# Patient Record
Sex: Male | Born: 1980 | Race: White | Hispanic: No | Marital: Married | Smoking: Never smoker
Health system: Southern US, Community
[De-identification: ages and names within clinical notes are randomized; demographics above are authoritative.]

## PROBLEM LIST (undated history)

## (undated) DIAGNOSIS — Z8709 Personal history of other diseases of the respiratory system: Secondary | ICD-10-CM

## (undated) DIAGNOSIS — F419 Anxiety disorder, unspecified: Secondary | ICD-10-CM

## (undated) DIAGNOSIS — R6510 Systemic inflammatory response syndrome (SIRS) of non-infectious origin without acute organ dysfunction: Secondary | ICD-10-CM

## (undated) DIAGNOSIS — J189 Pneumonia, unspecified organism: Secondary | ICD-10-CM

## (undated) HISTORY — PX: OTHER SURGICAL HISTORY: SHX169

## (undated) HISTORY — DX: Anxiety disorder, unspecified: F41.9

## (undated) HISTORY — DX: Systemic inflammatory response syndrome (sirs) of non-infectious origin without acute organ dysfunction: R65.10

## (undated) HISTORY — DX: Pneumonia, unspecified organism: J18.9

## (undated) HISTORY — DX: Personal history of other diseases of the respiratory system: Z87.09

---

## 2014-06-24 ENCOUNTER — Emergency Department: Payer: Self-pay | Admitting: Internal Medicine

## 2014-06-24 LAB — RAPID HIV SCREEN (HIV 1/2 AB+AG)

## 2016-06-28 DIAGNOSIS — J189 Pneumonia, unspecified organism: Secondary | ICD-10-CM

## 2016-06-28 DIAGNOSIS — Z8709 Personal history of other diseases of the respiratory system: Secondary | ICD-10-CM

## 2016-06-28 DIAGNOSIS — R6510 Systemic inflammatory response syndrome (SIRS) of non-infectious origin without acute organ dysfunction: Secondary | ICD-10-CM

## 2016-06-28 HISTORY — DX: Systemic inflammatory response syndrome (sirs) of non-infectious origin without acute organ dysfunction: R65.10

## 2016-06-28 HISTORY — DX: Pneumonia, unspecified organism: J18.9

## 2016-06-28 HISTORY — DX: Personal history of other diseases of the respiratory system: Z87.09

## 2016-07-11 ENCOUNTER — Inpatient Hospital Stay
Admission: EM | Admit: 2016-07-11 | Discharge: 2016-07-15 | DRG: 871 | Disposition: A | Discharge status: Home / Self Care | Payer: BC Managed Care – PPO | Attending: Internal Medicine | Admitting: Internal Medicine

## 2016-07-11 ENCOUNTER — Emergency Department: Payer: BC Managed Care – PPO

## 2016-07-11 ENCOUNTER — Encounter: Payer: Self-pay | Admitting: Emergency Medicine

## 2016-07-11 DIAGNOSIS — R1012 Left upper quadrant pain: Secondary | ICD-10-CM | POA: Diagnosis not present

## 2016-07-11 DIAGNOSIS — T402X5A Adverse effect of other opioids, initial encounter: Secondary | ICD-10-CM | POA: Diagnosis not present

## 2016-07-11 DIAGNOSIS — Y92230 Patient room in hospital as the place of occurrence of the external cause: Secondary | ICD-10-CM | POA: Diagnosis not present

## 2016-07-11 DIAGNOSIS — Z7289 Other problems related to lifestyle: Secondary | ICD-10-CM | POA: Diagnosis not present

## 2016-07-11 DIAGNOSIS — R000 Tachycardia, unspecified: Secondary | ICD-10-CM | POA: Diagnosis present

## 2016-07-11 DIAGNOSIS — F419 Anxiety disorder, unspecified: Secondary | ICD-10-CM | POA: Diagnosis present

## 2016-07-11 DIAGNOSIS — R4182 Altered mental status, unspecified: Secondary | ICD-10-CM | POA: Diagnosis present

## 2016-07-11 DIAGNOSIS — R091 Pleurisy: Secondary | ICD-10-CM

## 2016-07-11 DIAGNOSIS — J181 Lobar pneumonia, unspecified organism: Secondary | ICD-10-CM | POA: Diagnosis not present

## 2016-07-11 DIAGNOSIS — R0781 Pleurodynia: Secondary | ICD-10-CM | POA: Diagnosis not present

## 2016-07-11 DIAGNOSIS — R5383 Other fatigue: Secondary | ICD-10-CM | POA: Diagnosis not present

## 2016-07-11 DIAGNOSIS — Z9889 Other specified postprocedural states: Secondary | ICD-10-CM

## 2016-07-11 DIAGNOSIS — J918 Pleural effusion in other conditions classified elsewhere: Secondary | ICD-10-CM | POA: Diagnosis not present

## 2016-07-11 DIAGNOSIS — J189 Pneumonia, unspecified organism: Secondary | ICD-10-CM

## 2016-07-11 DIAGNOSIS — A419 Sepsis, unspecified organism: Secondary | ICD-10-CM | POA: Diagnosis present

## 2016-07-11 DIAGNOSIS — J9601 Acute respiratory failure with hypoxia: Secondary | ICD-10-CM | POA: Diagnosis present

## 2016-07-11 DIAGNOSIS — J90 Pleural effusion, not elsewhere classified: Secondary | ICD-10-CM

## 2016-07-11 DIAGNOSIS — R109 Unspecified abdominal pain: Secondary | ICD-10-CM

## 2016-07-11 DIAGNOSIS — R6510 Systemic inflammatory response syndrome (SIRS) of non-infectious origin without acute organ dysfunction: Secondary | ICD-10-CM

## 2016-07-11 DIAGNOSIS — J9690 Respiratory failure, unspecified, unspecified whether with hypoxia or hypercapnia: Secondary | ICD-10-CM

## 2016-07-11 LAB — CBC
HCT: 42.3 % (ref 40.0–52.0)
HEMOGLOBIN: 14.3 g/dL (ref 13.0–18.0)
MCH: 29.2 pg (ref 26.0–34.0)
MCHC: 33.9 g/dL (ref 32.0–36.0)
MCV: 86.3 fL (ref 80.0–100.0)
PLATELETS: 318 10*3/uL (ref 150–440)
RBC: 4.9 MIL/uL (ref 4.40–5.90)
RDW: 13.4 % (ref 11.5–14.5)
WBC: 14.6 10*3/uL — ABNORMAL HIGH (ref 3.8–10.6)

## 2016-07-11 LAB — BASIC METABOLIC PANEL
Anion gap: 8 (ref 5–15)
BUN: 16 mg/dL (ref 6–20)
CHLORIDE: 101 mmol/L (ref 101–111)
CO2: 28 mmol/L (ref 22–32)
CREATININE: 1 mg/dL (ref 0.61–1.24)
Calcium: 9.3 mg/dL (ref 8.9–10.3)
GFR calc non Af Amer: >60 mL/min (ref >60)
Glucose, Bld: 126 mg/dL — ABNORMAL HIGH (ref 65–99)
Potassium: 3.8 mmol/L (ref 3.5–5.1)
Sodium: 137 mmol/L (ref 135–145)

## 2016-07-11 LAB — LACTIC ACID, PLASMA
Lactic Acid, Venous: 1.6 mmol/L (ref 0.5–1.9)
Lactic Acid, Venous: 1.3 mmol/L (ref 0.5–1.9)

## 2016-07-11 LAB — TROPONIN I: Troponin I: <0.03 ng/mL (ref <0.03)

## 2016-07-11 MED ORDER — MORPHINE SULFATE (PF) 4 MG/ML IV SOLN
4.0000 mg | Freq: Once | INTRAVENOUS | Status: AC
  Administered 2016-07-11: 4 mg via INTRAVENOUS

## 2016-07-11 MED ORDER — ACETAMINOPHEN 650 MG RE SUPP: 650.0000 mg | Freq: Four times a day (QID) | RECTAL | Status: DC | PRN

## 2016-07-11 MED ORDER — DEXTROSE 5 % IV SOLN: 1.0000 g | Freq: Once | INTRAVENOUS | Status: DC

## 2016-07-11 MED ORDER — BISACODYL 10 MG RE SUPP: 10.0000 mg | Freq: Every day | RECTAL | Status: DC | PRN

## 2016-07-11 MED ORDER — CEFTRIAXONE SODIUM 2 G IJ SOLR
2.0000 g | INTRAMUSCULAR | Status: DC
  Administered 2016-07-12 – 2016-07-14 (×3): 2 g via INTRAVENOUS
  Filled 2016-07-11 (×5): qty 2

## 2016-07-11 MED ORDER — DEXTROSE 5 % IV SOLN
500.0000 mg | INTRAVENOUS | Status: DC
  Administered 2016-07-12 – 2016-07-14 (×3): 500 mg via INTRAVENOUS
  Filled 2016-07-11 (×5): qty 500

## 2016-07-11 MED ORDER — ONDANSETRON HCL 4 MG/2ML IJ SOLN: 4.0000 mg | Freq: Four times a day (QID) | INTRAMUSCULAR | Status: DC | PRN

## 2016-07-11 MED ORDER — ONDANSETRON HCL 4 MG PO TABS: 4.0000 mg | ORAL_TABLET | Freq: Four times a day (QID) | ORAL | Status: DC | PRN

## 2016-07-11 MED ORDER — DOCUSATE SODIUM 100 MG PO CAPS
100.0000 mg | ORAL_CAPSULE | Freq: Two times a day (BID) | ORAL | Status: DC
  Administered 2016-07-11 – 2016-07-15 (×8): 100 mg via ORAL
  Filled 2016-07-11 (×8): qty 1

## 2016-07-11 MED ORDER — LORAZEPAM 2 MG/ML IJ SOLN
INTRAMUSCULAR | Status: AC
  Administered 2016-07-11: 0.5 mg via INTRAVENOUS
  Filled 2016-07-11: qty 1

## 2016-07-11 MED ORDER — GUAIFENESIN ER 600 MG PO TB12
600.0000 mg | ORAL_TABLET | Freq: Two times a day (BID) | ORAL | Status: DC
  Administered 2016-07-11 – 2016-07-13 (×5): 600 mg via ORAL
  Filled 2016-07-11 (×6): qty 1

## 2016-07-11 MED ORDER — METHYLPREDNISOLONE SODIUM SUCC 125 MG IJ SOLR: 60.0000 mg | Freq: Four times a day (QID) | INTRAMUSCULAR | Status: DC

## 2016-07-11 MED ORDER — ACETAMINOPHEN 325 MG PO TABS
650.0000 mg | ORAL_TABLET | Freq: Four times a day (QID) | ORAL | Status: DC | PRN
  Administered 2016-07-11: 650 mg via ORAL
  Filled 2016-07-11: qty 2

## 2016-07-11 MED ORDER — ONDANSETRON HCL 4 MG/2ML IJ SOLN
4.0000 mg | Freq: Once | INTRAMUSCULAR | Status: AC
  Administered 2016-07-11: 4 mg via INTRAVENOUS

## 2016-07-11 MED ORDER — DEXTROSE 5 % IV SOLN: 1.0000 g | INTRAVENOUS | Status: DC

## 2016-07-11 MED ORDER — IPRATROPIUM-ALBUTEROL 0.5-2.5 (3) MG/3ML IN SOLN
RESPIRATORY_TRACT | Status: AC
  Administered 2016-07-12: 3 mL via RESPIRATORY_TRACT
  Filled 2016-07-11: qty 3

## 2016-07-11 MED ORDER — METHYLPREDNISOLONE SODIUM SUCC 125 MG IJ SOLR
INTRAMUSCULAR | Status: AC
  Filled 2016-07-11: qty 2

## 2016-07-11 MED ORDER — ONDANSETRON HCL 4 MG/2ML IJ SOLN
INTRAMUSCULAR | Status: AC
  Administered 2016-07-11: 4 mg via INTRAVENOUS
  Filled 2016-07-11: qty 2

## 2016-07-11 MED ORDER — HYDROCOD POLST-CPM POLST ER 10-8 MG/5ML PO SUER
5.0000 mL | Freq: Two times a day (BID) | ORAL | Status: DC | PRN
  Administered 2016-07-12: 5 mL via ORAL
  Filled 2016-07-11: qty 5

## 2016-07-11 MED ORDER — MORPHINE SULFATE (PF) 4 MG/ML IV SOLN
INTRAVENOUS | Status: AC
  Administered 2016-07-11: 4 mg via INTRAVENOUS
  Filled 2016-07-11: qty 1

## 2016-07-11 MED ORDER — AZITHROMYCIN 500 MG IV SOLR
500.0000 mg | Freq: Once | INTRAVENOUS | Status: AC
  Administered 2016-07-11: 500 mg via INTRAVENOUS
  Filled 2016-07-11: qty 500

## 2016-07-11 MED ORDER — CEFTRIAXONE SODIUM-DEXTROSE 1-3.74 GM-% IV SOLR
1.0000 g | Freq: Once | INTRAVENOUS | Status: AC
  Administered 2016-07-11: 1 g via INTRAVENOUS
  Filled 2016-07-11: qty 50

## 2016-07-11 MED ORDER — HEPARIN SODIUM (PORCINE) 5000 UNIT/ML IJ SOLN
5000.0000 [IU] | Freq: Three times a day (TID) | INTRAMUSCULAR | Status: DC
  Administered 2016-07-11 – 2016-07-15 (×9): 5000 [IU] via SUBCUTANEOUS
  Filled 2016-07-11 (×9): qty 1

## 2016-07-11 MED ORDER — LORAZEPAM 2 MG/ML IJ SOLN
0.5000 mg | Freq: Once | INTRAMUSCULAR | Status: AC
  Administered 2016-07-11: 0.5 mg via INTRAVENOUS

## 2016-07-11 MED ORDER — IOPAMIDOL (ISOVUE-370) INJECTION 76%
75.0000 mL | Freq: Once | INTRAVENOUS | Status: AC | PRN
  Administered 2016-07-11: 75 mL via INTRAVENOUS

## 2016-07-11 MED ORDER — DEXTROSE 5 % IV SOLN
500.0000 mg | INTRAVENOUS | Status: DC
  Filled 2016-07-11: qty 500

## 2016-07-11 MED ORDER — METHYLPREDNISOLONE SODIUM SUCC 125 MG IJ SOLR
125.0000 mg | INTRAMUSCULAR | Status: AC
  Administered 2016-07-11: 125 mg via INTRAVENOUS

## 2016-07-11 MED ORDER — IPRATROPIUM-ALBUTEROL 0.5-2.5 (3) MG/3ML IN SOLN
3.0000 mL | Freq: Four times a day (QID) | RESPIRATORY_TRACT | Status: DC
  Administered 2016-07-11 – 2016-07-13 (×8): 3 mL via RESPIRATORY_TRACT
  Filled 2016-07-11 (×8): qty 3

## 2016-07-11 MED ORDER — HYDROMORPHONE HCL 1 MG/ML IJ SOLN
1.0000 mg | INTRAMUSCULAR | Status: DC | PRN
  Administered 2016-07-11 – 2016-07-12 (×2): 1 mg via INTRAVENOUS
  Filled 2016-07-11 (×3): qty 1

## 2016-07-11 MED ORDER — SODIUM CHLORIDE 0.9% FLUSH
3.0000 mL | Freq: Two times a day (BID) | INTRAVENOUS | Status: DC
  Administered 2016-07-11 – 2016-07-15 (×6): 3 mL via INTRAVENOUS

## 2016-07-11 MED ORDER — DEXTROSE 5 % IV SOLN
1.0000 g | Freq: Once | INTRAVENOUS | Status: AC
  Administered 2016-07-11: 1 g via INTRAVENOUS
  Filled 2016-07-11: qty 10

## 2016-07-11 MED ORDER — SODIUM CHLORIDE 0.9 % IV SOLN
INTRAVENOUS | Status: DC
  Administered 2016-07-11 – 2016-07-12 (×3): via INTRAVENOUS

## 2016-07-11 MED ORDER — SODIUM CHLORIDE 0.9 % IV BOLUS (SEPSIS)
1000.0000 mL | Freq: Once | INTRAVENOUS | Status: AC
  Administered 2016-07-11: 1000 mL via INTRAVENOUS

## 2016-07-11 MED ORDER — LORAZEPAM 2 MG/ML IJ SOLN: 1.0000 mg | Freq: Four times a day (QID) | INTRAMUSCULAR | Status: DC | PRN

## 2016-07-11 NOTE — ED Provider Notes (Signed)
[facility] Emergency Department Provider Note   ____________________________________________   First MD Initiated Contact with Patient 07/11/16 [phone]     (approximate)  I have reviewed the triage vital signs and the nursing notes.   HISTORY  Chief Complaint Cough and Shortness of Breath   HPI Jesus Trujillo is a 36 y.o. male without any chronic medical conditions was presenting to the emergency department today with several days of left lower chest pain with deep breathing. He reports a mild cough but no fever at home. Denies any radiation of the pain. Does not smoke. No history of blood clots that he knows of in his family. Does not take any medications.   History reviewed. No pertinent past medical history.  There are no active problems to display for this patient.   History reviewed. No pertinent surgical history.  Prior to Admission medications   Not on File    Allergies Patient has no known allergies.  History reviewed. No pertinent family history.  Social History Social History  Substance Use Topics  . Smoking status: Never Smoker  . Smokeless tobacco: Never Used  . Alcohol use Yes     Comment: socially    Review of Systems Constitutional: No fever/chills Eyes: No visual changes. ENT: No sore throat. Cardiovascular:  As above Respiratory: as above Gastrointestinal: No abdominal pain.  No nausea, no vomiting.  No diarrhea.  No constipation. Genitourinary: Negative for dysuria. Musculoskeletal: Negative for back pain. Skin: Negative for rash. Neurological: Negative for headaches, focal weakness or numbness.  10-point ROS otherwise negative.  ____________________________________________   PHYSICAL EXAM:  VITAL SIGNS: ED Triage Vitals  Enc Vitals Group     BP 07/11/16 1637 125/75     Pulse Rate 07/11/16 1637 (!) 123     Resp 07/11/16 1637 (!) 25     Temp 07/11/16 1637 100.1 F (37.8 C)     Temp Source 07/11/16  1637 Oral     SpO2 07/11/16 1637 98 %     Weight --      Height --      Head Circumference --      Peak Flow --      Pain Score 07/11/16 1515 3     Pain Loc --      Pain Edu? --      Excl. in GC? --     Constitutional: Alert and oriented. Well appearing and in no acute distress. Eyes: Conjunctivae are normal. PERRL. EOMI. Head: Atraumatic. Nose: No congestion/rhinnorhea. Mouth/Throat: Mucous membranes are moist.   Neck: No stridor.   Cardiovascular:  tachycardic, regular rhythm. Grossly normal heart sounds.   Respiratory: Normal respiratory effort but tachypneic.  No retractions. Lungs CTAB. Gastrointestinal: Soft and nontender. No distention. No abdominal bruits. No CVA tenderness. Musculoskeletal: No lower extremity tenderness nor edema.  No joint effusions. Neurologic:  Normal speech and language. No gross focal neurologic deficits are appreciated. Skin:  Skin is warm, dry and intact. No rash noted. Psychiatric: Mood and affect are normal. Speech and behavior are normal.  ____________________________________________   LABS (all labs ordered are listed, but only abnormal results are displayed)  Labs Reviewed  BASIC METABOLIC PANEL - Abnormal; Notable for the following:       Result Value   Glucose, Bld 126 (*)    All other components within normal limits  CBC - Abnormal; Notable for the following:    WBC 14.6 (*)    All other components within normal limits  CULTURE, BLOOD (ROUTINE X 2)  CULTURE, BLOOD (ROUTINE X 2)  TROPONIN I  LACTIC ACID, PLASMA  LACTIC ACID, PLASMA   ____________________________________________  EKG  ED ECG REPORT I, Schaevitz,  Teena Irani, the attending physician, personally viewed and interpreted this ECG.   Date: 07/11/2016  EKG Time: 1516  Rate: 115  Rhythm: sinus tachycardia  Axis: normal  Intervals:none  ST&T Change: No ST segment elevation or depression. No abnormal T-wave  inversion.  ____________________________________________  RADIOLOGY    DG Chest 2 View (Final result)  Result time 07/11/16 16:32:57  Final result by Harmon Pier, MD (07/11/16 16:32:57)           Narrative:   CLINICAL DATA: Cough for 2-3 days.  EXAM: CHEST 2 VIEW  COMPARISON: None.  FINDINGS: This is a low volume film.  Bibasilar opacities are noted and may represent atelectasis and/or airspace disease.  There is no evidence of edema, pneumothorax or definite mass.  A trace left pleural effusion may be present.  IMPRESSION: Low volume film with bibasilar opacities which may represent atelectasis versus airspace disease/pneumonia. Possible trace left pleural effusion.   Electronically Signed By: Harmon Pier M.D. On: 07/11/2016 16:32         CT Angio Chest PE W and/or Wo Contrast (Accession [id number]) (Order [id number])  Imaging  Date: 07/11/2016 Department: [facility] EMERGENCY DEPARTMENT Released By/Authorizing: Myrna Blazer, MD (auto-released)  Exam Information   Status Exam Begun  Exam Ended   Final [99] 07/11/2016 5:04 PM 07/11/2016 5:17 PM  PACS Images   Show images for CT Angio Chest PE W and/or Wo Contrast  Study Result   CLINICAL DATA:  Tachycardia. Cough. Left lower pleuritic chest pain.  EXAM: CT ANGIOGRAPHY CHEST WITH CONTRAST  TECHNIQUE: Multidetector CT imaging of the chest was performed using the standard protocol during bolus administration of intravenous contrast. Multiplanar CT image reconstructions and MIPs were obtained to evaluate the vascular anatomy.  CONTRAST:  75 cc Isovue 370 IV.  COMPARISON:  Chest radiograph from earlier today.  FINDINGS: Cardiovascular: The study is low quality for the evaluation of pulmonary embolism due to limited contrast opacification of the pulmonary arteries and motion artifact, precluding evaluation of the subsegmental pulmonary artery branches  particularly in the left lung. There are no filling defects in the central, lobar or segmental pulmonary artery branches to suggest acute pulmonary embolism. Great vessels are normal in course and caliber. Normal heart size. No significant pericardial fluid/thickening.  Mediastinum/Nodes: No discrete thyroid nodules. Unremarkable esophagus. No axillary adenopathy. Mildly enlarged 1.1 cm subcarinal node (series 4/ image 35). No additional pathologically enlarged mediastinal or hilar nodes.  Lungs/Pleura: No pneumothorax. Small basilar left pleural effusion, possibly loculated with subpulmonic component. No right pleural effusion. There is patchy consolidation and ground-glass attenuation throughout the inferior segment lingula and basilar left lower lobe. No consolidative airspace disease in the right lung. No significant pulmonary nodules in the aerated portions of the lungs.  Upper abdomen: Unremarkable.  Musculoskeletal: No aggressive appearing focal osseous lesions. Mild thoracic spondylosis.  Review of the MIP images confirms the above findings.  IMPRESSION: 1. Limited scan with no evidence of pulmonary embolism . 2. Patchy consolidation and ground-glass attenuation throughout the inferior segment lingula and basilar left lower lobe, favor multilobar pneumonia. Recommend follow-up chest radiographs to resolution . 3. Small basilar left pleural effusion, possibly loculated . 4. Mild subcarinal lymphadenopathy, nonspecific, probably reactive.   Electronically Signed   By: Jannifer Rodney.D.  On: 07/11/2016 17:48    ____________________________________________   PROCEDURES  Procedure(s) performed:   Procedures  Critical Care performed:   ____________________________________________   INITIAL IMPRESSION / ASSESSMENT AND PLAN / ED COURSE  Pertinent labs & imaging results that were available during my care of the patient were reviewed by me and considered  in my medical decision making (see chart for details).  Patient with only mild cough. Could be pneumonia as indicated on x-ray. However, we will obtain a CAT scan for further detailed rule and out pulmonary embolus.    ----------------------------------------- 5:53 PM on 07/11/2016 -----------------------------------------  Patient with what appears to be a multilobar pneumonia on his CAT scan of the chest. To With an elevated white blood cell count with tachycardia. Sepsis alert was called. Patient admitted to the hospital. Signed out to Dr. Judithann Sheen. Patient as well as family member at the bedside are aware of the diagnosis as well as the need for addition to the hospital. They're understanding and willing to comply.  ____________________________________________   FINAL CLINICAL IMPRESSION(S) / ED DIAGNOSES  Multilobar pneumonia.    NEW MEDICATIONS STARTED DURING THIS VISIT:  New Prescriptions   No medications on file     Note:  This document was prepared using Dragon voice recognition software and may include unintentional dictation errors.    Myrna Blazer, MD 07/11/16 [phone]

## 2016-07-11 NOTE — Progress Notes (Signed)
Pharmacy Antibiotic Note  Jesus Trujillo is a 36 y.o. male admitted on 07/11/2016 with CAP.  Pharmacy has been consulted for ceftriaxone and azithromycin dosing.  Plan: 1. Ceftriaxone 1 gm IV x 1 in ED. Will give an additional 1 gm IV x 1 dose now and start ceftriaxone 2 gm IV Q24H tomorrow evening. 2. Azithromycin 500 mg IV Q24H  Height:  (180.3 cm) Weight: 293 lb 6.9 oz (133.1 kg) IBW/kg (Calculated) : 75.3  Temp (24hrs), Avg:100.3 F (37.9 C), Min:100.1 F (37.8 C), Max:100.4 F (38 C)   Recent Labs Lab 07/11/16 1515 07/11/16 1801  WBC 14.6*  --   CREATININE 1.00  --   LATICACIDVEN  --  1.6    Estimated Creatinine Clearance: 146.2 mL/min (by C-G formula based on SCr of 1 mg/dL).    No Known Allergies   Thank you for allowing pharmacy to be a part of this patient's care.  Carola Frost, Pharm.D., BCPS Clinical Pharmacist 07/11/2016 8:35 PM

## 2016-07-11 NOTE — H&P (Signed)
History and Physical    MONTOYA BRANDEL ZOX:[id number] DOB: [birth year] DOA: 07/11/2016  Referring physician: Dr. Pershing Proud PCP: No PCP Per Patient  Specialists: none  Chief Complaint: CP with SOB  HPI: COLVIN BLATT is a 36 y.o. male with no significant PMH on no meds who presents to ER with 1 week hx of progressive left-sided CP and SOB. Some fever and dry cough. In ER, pt in acute distress. Febrile and tachycardic. WBC elevated. CT shows left lingular and lower lobe pneumonia. Pt is in severe pain and borderline hypoxia. He is now admitted. No N/V/D. No cardiac hx  Review of Systems: The patient denies anorexia,  weight loss,, vision loss, decreased hearing, hoarseness, syncope, dyspnea on exertion, peripheral edema, balance deficits, hemoptysis, abdominal pain, melena, hematochezia, severe indigestion/heartburn, hematuria, incontinence, genital sores, muscle weakness, suspicious skin lesions, transient blindness, difficulty walking, depression, unusual weight change, abnormal bleeding, enlarged lymph nodes, angioedema, and breast masses.   History reviewed. No pertinent past medical history. History reviewed. No pertinent surgical history. Social History:  reports that he has never smoked. He has never used smokeless tobacco. He reports that he drinks alcohol. His drug history is not on file.  No Known Allergies  History reviewed. No pertinent family history.  Prior to Admission medications   Not on File   Physical Exam: Vitals:   07/11/16 1637  BP: 125/75  Pulse: (!) 123  Resp: (!) 25  Temp: 100.1 F (37.8 C)  TempSrc: Oral  SpO2: 98%     General:  Anxious in moderate distress, WDWN, [place]/AT  Eyes: PERRL, EOMI, no scleral icterus, conjunctiva clear  ENT: moist oropharynx without exudate, TM's benign, dentition good  Neck: supple, no lymphadenopathy. No bruits or thyromegaly  Cardiovascular: rapid rate with regular rhythm without MRG; 2+ peripheral pulses,  no JVD, no peripheral edema  Respiratory: Decreased breath sounds with left-sided rhonchi. No wheezes or rales. Respiratory effort increased  Abdomen: soft, non tender to palpation, positive bowel sounds, no guarding, no rebound  Skin: no rashes or lesions  Musculoskeletal: normal bulk and tone, no joint swelling  Psychiatric: normal mood and affect, A&OX3  Neurologic: CN 2-12 grossly intact, Motor strength 5/5 in all 4 groups with symmetric DTR's and non-focal sensory exam  Labs on Admission:  Basic Metabolic Panel:  Recent Labs Lab 07/11/16 1515  NA 137  K 3.8  CL 101  CO2 28  GLUCOSE 126*  BUN 16  CREATININE 1.00  CALCIUM 9.3   Liver Function Tests: No results for input(s): AST, ALT, ALKPHOS, BILITOT, PROT, ALBUMIN in the last 168 hours. No results for input(s): LIPASE, AMYLASE in the last 168 hours. No results for input(s): AMMONIA in the last 168 hours. CBC:  Recent Labs Lab 07/11/16 1515  WBC 14.6*  HGB 14.3  HCT 42.3  MCV 86.3  PLT 318   Cardiac Enzymes:  Recent Labs Lab 07/11/16 1515  TROPONINI <0.03    BNP (last 3 results) No results for input(s): BNP in the last 8760 hours.  ProBNP (last 3 results) No results for input(s): PROBNP in the last 8760 hours.  CBG: No results for input(s): GLUCAP in the last 168 hours.  Radiological Exams on Admission: Dg Chest 2 View  Result Date: 07/11/2016 CLINICAL DATA:  Cough for 2-3 days. EXAM: CHEST  2 VIEW COMPARISON:  None. FINDINGS: This is a low volume film. Bibasilar opacities are noted and may represent atelectasis and/or airspace disease. There is no evidence of edema, pneumothorax or  definite mass. A trace left pleural effusion may be present. IMPRESSION: Low volume film with bibasilar opacities which may represent atelectasis versus airspace disease/pneumonia. Possible trace left pleural effusion. Electronically Signed   By: Harmon Pier M.D.   On: 07/11/2016 16:32   Ct Angio Chest Pe W And/or Wo  Contrast  Result Date: 07/11/2016 CLINICAL DATA:  Tachycardia. Cough. Left lower pleuritic chest pain. EXAM: CT ANGIOGRAPHY CHEST WITH CONTRAST TECHNIQUE: Multidetector CT imaging of the chest was performed using the standard protocol during bolus administration of intravenous contrast. Multiplanar CT image reconstructions and MIPs were obtained to evaluate the vascular anatomy. CONTRAST:  75 cc Isovue 370 IV. COMPARISON:  Chest radiograph from earlier today. FINDINGS: Cardiovascular: The study is low quality for the evaluation of pulmonary embolism due to limited contrast opacification of the pulmonary arteries and motion artifact, precluding evaluation of the subsegmental pulmonary artery branches particularly in the left lung. There are no filling defects in the central, lobar or segmental pulmonary artery branches to suggest acute pulmonary embolism. Great vessels are normal in course and caliber. Normal heart size. No significant pericardial fluid/thickening. Mediastinum/Nodes: No discrete thyroid nodules. Unremarkable esophagus. No axillary adenopathy. Mildly enlarged 1.1 cm subcarinal node (series 4/ image 35). No additional pathologically enlarged mediastinal or hilar nodes. Lungs/Pleura: No pneumothorax. Small basilar left pleural effusion, possibly loculated with subpulmonic component. No right pleural effusion. There is patchy consolidation and ground-glass attenuation throughout the inferior segment lingula and basilar left lower lobe. No consolidative airspace disease in the right lung. No significant pulmonary nodules in the aerated portions of the lungs. Upper abdomen: Unremarkable. Musculoskeletal: No aggressive appearing focal osseous lesions. Mild thoracic spondylosis. Review of the MIP images confirms the above findings. IMPRESSION: 1. Limited scan with no evidence of pulmonary embolism . 2. Patchy consolidation and ground-glass attenuation throughout the inferior segment lingula and basilar  left lower lobe, favor multilobar pneumonia. Recommend follow-up chest radiographs to resolution . 3. Small basilar left pleural effusion, possibly loculated . 4. Mild subcarinal lymphadenopathy, nonspecific, probably reactive. Electronically Signed   By: Delbert Phenix M.D.   On: 07/11/2016 17:48    EKG: Independently reviewed.  Assessment/Plan Principal Problem:   SIRS (systemic inflammatory response syndrome) (HCC) Active Problems:   Multifocal pneumonia   Pleurisy   Anxiety   Will admit to floor with IV steroids and IV ABX with SVN's and prn O2. Cultures sent. IV Dilaudid for pain. Repeat labs in AM.  Diet: clear liquids Fluids: NS@100  DVT Prophylaxis: SQ heparin  Code Status: FULL  Family Communication: yes  Disposition Plan: home  Time spent: 55 min

## 2016-07-11 NOTE — ED Triage Notes (Signed)
Pt states cough x 2-3 days - slight pain during that time with deep breath. Now sharper pain with deep breath in left lower ribs

## 2016-07-12 LAB — COMPREHENSIVE METABOLIC PANEL
ALK PHOS: 75 U/L (ref 38–126)
ALT: 30 U/L (ref 17–63)
AST: 21 U/L (ref 15–41)
Albumin: 3.6 g/dL (ref 3.5–5.0)
Anion gap: 8 (ref 5–15)
BUN: 13 mg/dL (ref 6–20)
CALCIUM: 9 mg/dL (ref 8.9–10.3)
CO2: 25 mmol/L (ref 22–32)
CREATININE: 0.75 mg/dL (ref 0.61–1.24)
Chloride: 103 mmol/L (ref 101–111)
GFR calc Af Amer: >60 mL/min (ref >60)
GFR calc non Af Amer: >60 mL/min (ref >60)
Glucose, Bld: 144 mg/dL — ABNORMAL HIGH (ref 65–99)
Potassium: 4.5 mmol/L (ref 3.5–5.1)
SODIUM: 136 mmol/L (ref 135–145)
Total Bilirubin: 0.8 mg/dL (ref 0.3–1.2)
Total Protein: 8 g/dL (ref 6.5–8.1)

## 2016-07-12 LAB — CBC
HCT: 40 % (ref 40.0–52.0)
Hemoglobin: 13.6 g/dL (ref 13.0–18.0)
MCH: 29.8 pg (ref 26.0–34.0)
MCHC: 33.8 g/dL (ref 32.0–36.0)
MCV: 88.1 fL (ref 80.0–100.0)
PLATELETS: 287 10*3/uL (ref 150–440)
RBC: 4.55 MIL/uL (ref 4.40–5.90)
RDW: 13.2 % (ref 11.5–14.5)
WBC: 18 10*3/uL — ABNORMAL HIGH (ref 3.8–10.6)

## 2016-07-12 LAB — GLUCOSE, CAPILLARY: GLUCOSE-CAPILLARY: 137 mg/dL — AB (ref 65–99)

## 2016-07-12 MED ORDER — LORAZEPAM 2 MG/ML IJ SOLN
1.0000 mg | Freq: Four times a day (QID) | INTRAMUSCULAR | Status: DC | PRN
  Administered 2016-07-12 – 2016-07-14 (×4): 1 mg via INTRAVENOUS
  Filled 2016-07-12 (×4): qty 1

## 2016-07-12 MED ORDER — METHYLPREDNISOLONE SODIUM SUCC 40 MG IJ SOLR
40.0000 mg | Freq: Every day | INTRAMUSCULAR | Status: DC
  Administered 2016-07-13 – 2016-07-14 (×2): 40 mg via INTRAVENOUS
  Filled 2016-07-12 (×2): qty 1

## 2016-07-12 MED ORDER — OXYCODONE HCL 5 MG PO TABS: 5.0000 mg | ORAL_TABLET | ORAL | Status: DC | PRN

## 2016-07-12 MED ORDER — OXYCODONE HCL 5 MG PO TABS
10.0000 mg | ORAL_TABLET | ORAL | Status: DC | PRN
  Administered 2016-07-12 (×2): 10 mg via ORAL
  Filled 2016-07-12 (×2): qty 2

## 2016-07-12 MED ORDER — MORPHINE SULFATE (PF) 4 MG/ML IV SOLN
4.0000 mg | Freq: Once | INTRAVENOUS | Status: AC
  Administered 2016-07-12: 4 mg via INTRAVENOUS
  Filled 2016-07-12: qty 1

## 2016-07-12 MED ORDER — HYDROMORPHONE HCL 1 MG/ML IJ SOLN
2.0000 mg | Freq: Once | INTRAMUSCULAR | Status: AC
  Administered 2016-07-12: 2 mg via INTRAVENOUS
  Filled 2016-07-12: qty 2

## 2016-07-12 NOTE — Progress Notes (Signed)
Patient reports no pain relief with morphine. He has LUQ abdominal tenderness that is made worse by activity and deep breathing. Abd is soft, no distension. +BS, denies n/v/d. VS stable. Dr. Judithann Sheen made aware. Will administer dilaudid per order and monitor for response.

## 2016-07-12 NOTE — Progress Notes (Addendum)
Pt again called out very short of breath and gasping and grunting. Pt states he is pain and is having difficulty taking deep breaths. Lung sounds on assessment were clear but diminished, oxygen sats are 96% on room air, pt is tachypneic at about 28 breaths a minute. Placed on [employer] for comfort. Pt is diaphoretic with a temp of 97.4 orally, FSBS checked and was 137. Also, will pause IVF in case there is fluid volume overload. On call MD paged and made aware of situation, ordered one time dosage of IV morphine. Dose given, will monitor situation

## 2016-07-12 NOTE — Progress Notes (Signed)
Family called this nurse and reports pt is very anxious and appears to be having a "panic attack". Pt is obviously panting and short of breath. Pt does appear anxious. Family member tells me he had a similar episode yesterday and ativan relieved the panic attack. On call Md paged and placed orders for IV ativan. Will give dose and monitor situation.

## 2016-07-12 NOTE — Progress Notes (Signed)
Patient ID: Jesus Trujillo, male   DOB: 1980-09-15, 36 y.o.   MRN: [id number]  [facility] PROGRESS NOTE  SHIRO ELLERMAN WUJ:[id number] DOB: 1980-08-03 DOA: 07/11/2016 PCP: No PCP Per Patient  HPI/Subjective: Patient feeling better than he was yesterday. Admitted with pleuritic chest pain and shortness of breath and found to have multifocal pneumonia.  Objective: Vitals:   07/12/16 0649 07/12/16 1255  BP: (!) 110/56 (!) 107/52  Pulse: 70 (!) 107  Resp: (!) 24   Temp: 97.5 F (36.4 C) 97.7 F (36.5 C)    Filed Weights   07/11/16 1809 07/11/16 2012 07/12/16 0411  Weight: 133.1 kg (293 lb 6.9 oz) 124.4 kg (274 lb 3.2 oz) 124.4 kg (274 lb 3.2 oz)    ROS: Review of Systems  Constitutional: Negative for chills and fever.  Eyes: Negative for blurred vision.  Respiratory: Positive for cough and shortness of breath.   Cardiovascular: Negative for chest pain.  Gastrointestinal: Negative for abdominal pain, constipation, diarrhea, nausea and vomiting.  Genitourinary: Negative for dysuria.  Musculoskeletal: Negative for joint pain.  Neurological: Negative for dizziness and headaches.   Exam: Physical Exam  Constitutional: He is oriented to person, place, and time.  HENT:  Nose: No mucosal edema.  Mouth/Throat: No oropharyngeal exudate or posterior oropharyngeal edema.  Eyes: Conjunctivae, EOM and lids are normal. Pupils are equal, round, and reactive to light.  Neck: No JVD present. Carotid bruit is not present. No edema present. No thyroid mass and no thyromegaly present.  Cardiovascular: S1 normal and S2 normal.  Exam reveals no gallop.   No murmur heard. Pulses:      Dorsalis pedis pulses are 2+ on the right side, and 2+ on the left side.  Respiratory: No respiratory distress. He has decreased breath sounds in the right lower field, the left middle field and the left lower field. He has no wheezes. He has rhonchi in the right lower field, the left middle field and  the left lower field. He has no rales.  GI: Soft. Bowel sounds are normal. There is no tenderness.  Musculoskeletal:       Right ankle: He exhibits no swelling.       Left ankle: He exhibits no swelling.  Lymphadenopathy:    He has no cervical adenopathy.  Neurological: He is alert and oriented to person, place, and time. No cranial nerve deficit.  Skin: Skin is warm. No rash noted. Nails show no clubbing.  Psychiatric: He has a normal mood and affect.      Data Reviewed: Basic Metabolic Panel:  Recent Labs Lab 07/11/16 1515 07/12/16 0428  NA 137 136  K 3.8 4.5  CL 101 103  CO2 28 25  GLUCOSE 126* 144*  BUN 16 13  CREATININE 1.00 0.75  CALCIUM 9.3 9.0   Liver Function Tests:  Recent Labs Lab 07/12/16 0428  AST 21  ALT 30  ALKPHOS 75  BILITOT 0.8  PROT 8.0  ALBUMIN 3.6   CBC:  Recent Labs Lab 07/11/16 1515 07/12/16 0428  WBC 14.6* 18.0*  HGB 14.3 13.6  HCT 42.3 40.0  MCV 86.3 88.1  PLT 318 287   Cardiac Enzymes:  Recent Labs Lab 07/11/16 1515  TROPONINI <0.03     Recent Results (from the past 240 hour(s))  Blood Culture (routine x 2)     Status: None (Preliminary result)   Collection Time: 07/11/16  6:01 PM  Result Value Ref Range Status   Specimen Description BLOOD LEFT HAND  Final   Special Requests   Final    BOTTLES DRAWN AEROBIC AND ANAEROBIC Blood Culture adequate volume   Culture NO GROWTH < 24 HOURS  Final   Report Status PENDING  Incomplete  Blood Culture (routine x 2)     Status: None (Preliminary result)   Collection Time: 07/11/16  6:01 PM  Result Value Ref Range Status   Specimen Description BLOOD RIGHT ARM  Final   Special Requests   Final    BOTTLES DRAWN AEROBIC AND ANAEROBIC Blood Culture results may not be optimal due to an excessive volume of blood received in culture bottles   Culture NO GROWTH < 24 HOURS  Final   Report Status PENDING  Incomplete     Studies: Dg Chest 2 View  Result Date: 07/11/2016 CLINICAL  DATA:  Cough for 2-3 days. EXAM: CHEST  2 VIEW COMPARISON:  None. FINDINGS: This is a low volume film. Bibasilar opacities are noted and may represent atelectasis and/or airspace disease. There is no evidence of edema, pneumothorax or definite mass. A trace left pleural effusion may be present. IMPRESSION: Low volume film with bibasilar opacities which may represent atelectasis versus airspace disease/pneumonia. Possible trace left pleural effusion. Electronically Signed   By: Harmon Pier M.D.   On: 07/11/2016 16:32   Ct Angio Chest Pe W And/or Wo Contrast  Result Date: 07/11/2016 CLINICAL DATA:  Tachycardia. Cough. Left lower pleuritic chest pain. EXAM: CT ANGIOGRAPHY CHEST WITH CONTRAST TECHNIQUE: Multidetector CT imaging of the chest was performed using the standard protocol during bolus administration of intravenous contrast. Multiplanar CT image reconstructions and MIPs were obtained to evaluate the vascular anatomy. CONTRAST:  75 cc Isovue 370 IV. COMPARISON:  Chest radiograph from earlier today. FINDINGS: Cardiovascular: The study is low quality for the evaluation of pulmonary embolism due to limited contrast opacification of the pulmonary arteries and motion artifact, precluding evaluation of the subsegmental pulmonary artery branches particularly in the left lung. There are no filling defects in the central, lobar or segmental pulmonary artery branches to suggest acute pulmonary embolism. Great vessels are normal in course and caliber. Normal heart size. No significant pericardial fluid/thickening. Mediastinum/Nodes: No discrete thyroid nodules. Unremarkable esophagus. No axillary adenopathy. Mildly enlarged 1.1 cm subcarinal node (series 4/ image 35). No additional pathologically enlarged mediastinal or hilar nodes. Lungs/Pleura: No pneumothorax. Small basilar left pleural effusion, possibly loculated with subpulmonic component. No right pleural effusion. There is patchy consolidation and ground-glass  attenuation throughout the inferior segment lingula and basilar left lower lobe. No consolidative airspace disease in the right lung. No significant pulmonary nodules in the aerated portions of the lungs. Upper abdomen: Unremarkable. Musculoskeletal: No aggressive appearing focal osseous lesions. Mild thoracic spondylosis. Review of the MIP images confirms the above findings. IMPRESSION: 1. Limited scan with no evidence of pulmonary embolism . 2. Patchy consolidation and ground-glass attenuation throughout the inferior segment lingula and basilar left lower lobe, favor multilobar pneumonia. Recommend follow-up chest radiographs to resolution . 3. Small basilar left pleural effusion, possibly loculated . 4. Mild subcarinal lymphadenopathy, nonspecific, probably reactive. Electronically Signed   By: Delbert Phenix M.D.   On: 07/11/2016 17:48    Scheduled Meds: . azithromycin  500 mg Intravenous Q24H  . cefTRIAXone (ROCEPHIN)  IV  2 g Intravenous Q24H  . docusate sodium  100 mg Oral BID  . guaiFENesin  600 mg Oral BID  . heparin  5,000 Units Subcutaneous Q8H  . ipratropium-albuterol  3 mL Nebulization QID  . [START  ON 07/13/2016] methylPREDNISolone (SOLU-MEDROL) injection  40 mg Intravenous Daily  . sodium chloride flush  3 mL Intravenous Q12H   Continuous Infusions: . sodium chloride 100 mL/hr at 07/12/16 0659    Assessment/Plan:  1. Clinical sepsis present on admission with fever, tachycardia and leukocytosis and multifocal pneumonia seen on CT scan of the chest. Patient on Rocephin and Zithromax. So far blood cultures are negative. Continue nebulizer treatments 2. Pleuritic chest pain is better today. Stop IV pain medication and anxiety medication. Oral pain medication if needed. Decrease steroid dose down to daily dosing.  Code Status:     Code Status Orders        Start     Ordered   07/11/16 2001  Full code  Continuous     07/11/16 2000    Code Status History    Date Active Date  Inactive Code Status Order ID Comments User Context   This patient has a current code status but no historical code status.     Family Communication: Family at the bedside Disposition Plan: Potentially home tomorrow or the next day depending on clinical course  Antibiotics:  Rocephin  Zithromax  Time spent: 28 minutes  Alford Highland  [employer]

## 2016-07-13 ENCOUNTER — Inpatient Hospital Stay: Payer: BC Managed Care – PPO

## 2016-07-13 DIAGNOSIS — R5383 Other fatigue: Secondary | ICD-10-CM

## 2016-07-13 DIAGNOSIS — J181 Lobar pneumonia, unspecified organism: Secondary | ICD-10-CM

## 2016-07-13 DIAGNOSIS — J189 Pneumonia, unspecified organism: Secondary | ICD-10-CM

## 2016-07-13 DIAGNOSIS — R0781 Pleurodynia: Secondary | ICD-10-CM

## 2016-07-13 DIAGNOSIS — J918 Pleural effusion in other conditions classified elsewhere: Secondary | ICD-10-CM

## 2016-07-13 LAB — BLOOD GAS, ARTERIAL
ACID-BASE EXCESS: 1.2 mmol/L (ref 0.0–2.0)
BICARBONATE: 25.1 mmol/L (ref 20.0–28.0)
FIO2: 0.32
O2 Saturation: 90.8 %
PATIENT TEMPERATURE: 37
pCO2 arterial: 37 mmHg (ref 32.0–48.0)
pH, Arterial: 7.44 (ref 7.350–7.450)
pO2, Arterial: 58 mmHg — ABNORMAL LOW (ref 83.0–108.0)

## 2016-07-13 LAB — STREP PNEUMONIAE URINARY ANTIGEN: STREP PNEUMO URINARY ANTIGEN: NEGATIVE

## 2016-07-13 LAB — LIPASE, BLOOD: Lipase: 14 U/L (ref 11–51)

## 2016-07-13 LAB — HIV ANTIBODY (ROUTINE TESTING W REFLEX): HIV Screen 4th Generation wRfx: NONREACTIVE

## 2016-07-13 LAB — PROCALCITONIN: Procalcitonin: 0.11 ng/mL

## 2016-07-13 LAB — MRSA PCR SCREENING: MRSA BY PCR: NEGATIVE

## 2016-07-13 MED ORDER — OXYCODONE-ACETAMINOPHEN 5-325 MG PO TABS
1.0000 | ORAL_TABLET | Freq: Four times a day (QID) | ORAL | Status: DC | PRN
  Administered 2016-07-14: 1 via ORAL
  Filled 2016-07-13: qty 1

## 2016-07-13 MED ORDER — IPRATROPIUM-ALBUTEROL 0.5-2.5 (3) MG/3ML IN SOLN
3.0000 mL | Freq: Four times a day (QID) | RESPIRATORY_TRACT | Status: DC
  Administered 2016-07-13 – 2016-07-14 (×4): 3 mL via RESPIRATORY_TRACT
  Filled 2016-07-13 (×4): qty 3

## 2016-07-13 MED ORDER — HYDROMORPHONE HCL 1 MG/ML IJ SOLN
INTRAMUSCULAR | Status: AC
  Filled 2016-07-13: qty 1

## 2016-07-13 MED ORDER — KETOROLAC TROMETHAMINE 30 MG/ML IJ SOLN
30.0000 mg | Freq: Four times a day (QID) | INTRAMUSCULAR | Status: DC | PRN
  Administered 2016-07-13 – 2016-07-14 (×2): 30 mg via INTRAVENOUS
  Filled 2016-07-13 (×2): qty 1

## 2016-07-13 MED ORDER — GUAIFENESIN 100 MG/5ML PO SOLN
5.0000 mL | ORAL | Status: DC | PRN
  Filled 2016-07-13: qty 5

## 2016-07-13 MED ORDER — KETOROLAC TROMETHAMINE 30 MG/ML IJ SOLN
60.0000 mg | Freq: Once | INTRAMUSCULAR | Status: AC
  Administered 2016-07-13: 60 mg via INTRAVENOUS
  Filled 2016-07-13: qty 2

## 2016-07-13 MED ORDER — HYDROMORPHONE HCL 1 MG/ML IJ SOLN
1.0000 mg | INTRAMUSCULAR | Status: DC | PRN
  Administered 2016-07-13 (×2): 1 mg via INTRAVENOUS
  Filled 2016-07-13: qty 1

## 2016-07-13 MED ORDER — BUDESONIDE 0.5 MG/2ML IN SUSP
0.5000 mg | Freq: Two times a day (BID) | RESPIRATORY_TRACT | Status: DC
  Administered 2016-07-13 – 2016-07-15 (×5): 0.5 mg via RESPIRATORY_TRACT
  Filled 2016-07-13 (×5): qty 2

## 2016-07-13 NOTE — Progress Notes (Signed)
Patient reports recurrent sudden severe abdominal pain to LUQ radiating to back. States it is "the worst pain I've ever had," Still denies any GI symptoms.Pt is unable to tolerate any activity d/t pain. Remains tachypneic with shallow breathing. Dr.Pyreddy notified, new orders received, will monitor.

## 2016-07-13 NOTE — Progress Notes (Signed)
Patient only voided 100 cc this shift. Poor po intake d/t pain. Bladder scan did not reveal any urinary retention. Dr.Pyreddy made aware, fluids increased.

## 2016-07-13 NOTE — Progress Notes (Signed)
Patient ID: Jesus Trujillo, male   DOB: 1980/11/16, 36 y.o.   MRN: [id number]   [facility] PROGRESS NOTE  Jesus Trujillo [id number]:[id number] DOB: [birth year] DOA: 07/11/2016 PCP: No PCP Per Patient  HPI/Subjective: I walked into the room and the patient is lethargic. I woke him up and he was able to talk a little bit but fell right back asleep. The patient was placed back on IV dilauded last night and received a dose at 8 AM this morning.  The patient did not want me to reverse the pain medications at this time.  Objective: Vitals:   07/13/16 0330 07/13/16 0457  BP:  112/74  Pulse: (!) 109 (!) 102  Resp: (!) 22 18  Temp:  97.5 F (36.4 C)    Filed Weights   07/11/16 2012 07/12/16 0411 07/13/16 0500  Weight: 124.4 kg (274 lb 3.2 oz) 124.4 kg (274 lb 3.2 oz) 126.3 kg (278 lb 8 oz)    ROS: Review of Systems  Unable to perform ROS: Mental status change   Exam: Physical Exam  Constitutional: He is oriented to person, place, and time.  HENT:  Nose: No mucosal edema.  Mouth/Throat: No oropharyngeal exudate or posterior oropharyngeal edema.  Eyes: Conjunctivae, EOM and lids are normal. Pupils are equal, round, and reactive to light.  Neck: No JVD present. Carotid bruit is not present. No edema present. No thyroid mass and no thyromegaly present.  Cardiovascular: S1 normal and S2 normal.  Exam reveals no gallop.   No murmur heard. Pulses:      Dorsalis pedis pulses are 2+ on the right side, and 2+ on the left side.  Respiratory: No respiratory distress. He has decreased breath sounds in the right upper field, the right middle field, the right lower field, the left upper field, the left middle field and the left lower field. He has no wheezes. He has rhonchi in the right lower field and the left lower field. He has no rales.  GI: Soft. Bowel sounds are normal. There is no tenderness.  Musculoskeletal:       Right ankle: He exhibits no swelling.       Left ankle: He exhibits  no swelling.  Lymphadenopathy:    He has no cervical adenopathy.  Neurological: He is alert and oriented to person, place, and time. No cranial nerve deficit.  Skin: Skin is warm. No rash noted. Nails show no clubbing.  Psychiatric: He has a normal mood and affect.      Data Reviewed: Basic Metabolic Panel:  Recent Labs Lab 07/11/16 1515 07/12/16 0428  NA 137 136  K 3.8 4.5  CL 101 103  CO2 28 25  GLUCOSE 126* 144*  BUN 16 13  CREATININE 1.00 0.75  CALCIUM 9.3 9.0   Liver Function Tests:  Recent Labs Lab 07/12/16 0428  AST 21  ALT 30  ALKPHOS 75  BILITOT 0.8  PROT 8.0  ALBUMIN 3.6   CBC:  Recent Labs Lab 07/11/16 1515 07/12/16 0428  WBC 14.6* 18.0*  HGB 14.3 13.6  HCT 42.3 40.0  MCV 86.3 88.1  PLT 318 287   Cardiac Enzymes:  Recent Labs Lab 07/11/16 1515  TROPONINI <0.03     Recent Results (from the past 240 hour(s))  Blood Culture (routine x 2)     Status: None (Preliminary result)   Collection Time: 07/11/16  6:01 PM  Result Value Ref Range Status   Specimen Description BLOOD LEFT HAND  Final   Special  Requests   Final    BOTTLES DRAWN AEROBIC AND ANAEROBIC Blood Culture adequate volume   Culture NO GROWTH 2 DAYS  Final   Report Status PENDING  Incomplete  Blood Culture (routine x 2)     Status: None (Preliminary result)   Collection Time: 07/11/16  6:01 PM  Result Value Ref Range Status   Specimen Description BLOOD RIGHT ARM  Final   Special Requests   Final    BOTTLES DRAWN AEROBIC AND ANAEROBIC Blood Culture results may not be optimal due to an excessive volume of blood received in culture bottles   Culture NO GROWTH 2 DAYS  Final   Report Status PENDING  Incomplete     Studies: Dg Chest 2 View  Result Date: 07/11/2016 CLINICAL DATA:  Cough for 2-3 days. EXAM: CHEST  2 VIEW COMPARISON:  None. FINDINGS: This is a low volume film. Bibasilar opacities are noted and may represent atelectasis and/or airspace disease. There is no  evidence of edema, pneumothorax or definite mass. A trace left pleural effusion may be present. IMPRESSION: Low volume film with bibasilar opacities which may represent atelectasis versus airspace disease/pneumonia. Possible trace left pleural effusion. Electronically Signed   By: Harmon Pier M.D.   On: 07/11/2016 16:32   Ct Angio Chest Pe W And/or Wo Contrast  Result Date: 07/11/2016 CLINICAL DATA:  Tachycardia. Cough. Left lower pleuritic chest pain. EXAM: CT ANGIOGRAPHY CHEST WITH CONTRAST TECHNIQUE: Multidetector CT imaging of the chest was performed using the standard protocol during bolus administration of intravenous contrast. Multiplanar CT image reconstructions and MIPs were obtained to evaluate the vascular anatomy. CONTRAST:  75 cc Isovue 370 IV. COMPARISON:  Chest radiograph from earlier today. FINDINGS: Cardiovascular: The study is low quality for the evaluation of pulmonary embolism due to limited contrast opacification of the pulmonary arteries and motion artifact, precluding evaluation of the subsegmental pulmonary artery branches particularly in the left lung. There are no filling defects in the central, lobar or segmental pulmonary artery branches to suggest acute pulmonary embolism. Great vessels are normal in course and caliber. Normal heart size. No significant pericardial fluid/thickening. Mediastinum/Nodes: No discrete thyroid nodules. Unremarkable esophagus. No axillary adenopathy. Mildly enlarged 1.1 cm subcarinal node (series 4/ image 35). No additional pathologically enlarged mediastinal or hilar nodes. Lungs/Pleura: No pneumothorax. Small basilar left pleural effusion, possibly loculated with subpulmonic component. No right pleural effusion. There is patchy consolidation and ground-glass attenuation throughout the inferior segment lingula and basilar left lower lobe. No consolidative airspace disease in the right lung. No significant pulmonary nodules in the aerated portions of the  lungs. Upper abdomen: Unremarkable. Musculoskeletal: No aggressive appearing focal osseous lesions. Mild thoracic spondylosis. Review of the MIP images confirms the above findings. IMPRESSION: 1. Limited scan with no evidence of pulmonary embolism . 2. Patchy consolidation and ground-glass attenuation throughout the inferior segment lingula and basilar left lower lobe, favor multilobar pneumonia. Recommend follow-up chest radiographs to resolution . 3. Small basilar left pleural effusion, possibly loculated . 4. Mild subcarinal lymphadenopathy, nonspecific, probably reactive. Electronically Signed   By: Delbert Phenix M.D.   On: 07/11/2016 17:48   Ct Renal Stone Study  Result Date: 07/13/2016 CLINICAL DATA:  Left flank pain EXAM: CT ABDOMEN AND PELVIS WITHOUT CONTRAST TECHNIQUE: Multidetector CT imaging of the abdomen and pelvis was performed following the standard protocol without IV contrast. COMPARISON:  Chest CT 07/11/2016 FINDINGS: Lower chest: There is consolidation of the left lower lobe with a small pleural effusion. Hepatobiliary: Normal noncontrast  appearance of the liver. No visible biliary dilatation. Normal gallbladder. Pancreas: Normal noncontrast appearance of the pancreas. No peripancreatic fluid collection. Spleen: Normal. Adrenal glands: Normal. Urinary Tract: --Right kidney/ureter: No hydronephrosis or perinephric stranding. No nephrolithiasis. No obstructing ureteral stones. --Left kidney/ureter: No hydronephrosis or perinephric stranding. No nephrolithiasis. No obstructing ureteral stones. --Urinary bladder: Unremarkable. Stomach/Bowel: No dilated loops of bowel. No evidence of colonic or enteric inflammation. No fluid collection within the abdomen. Vascular/Lymphatic: No abdominal aortic aneurysm or atherosclerotic calcification. No abdominal or pelvic lymphadenopathy. Reproductive: Normal prostate and seminal vesicles. Musculoskeletal. No focal osseous lesion. Normal visualized  extraperitoneal and extrathoracic soft tissues. IMPRESSION: 1. No obstructive uropathy or nephrolithiasis. 2. Left lower lobe consolidation and small pleural effusion, unchanged from recent chest CT and again likely indicating pneumonia. Electronically Signed   By: Deatra Robinson M.D.   On: 07/13/2016 03:17    Scheduled Meds: . azithromycin  500 mg Intravenous Q24H  . budesonide (PULMICORT) nebulizer solution  0.5 mg Nebulization BID  . cefTRIAXone (ROCEPHIN)  IV  2 g Intravenous Q24H  . docusate sodium  100 mg Oral BID  . guaiFENesin  600 mg Oral BID  . heparin  5,000 Units Subcutaneous Q8H  . ipratropium-albuterol  3 mL Nebulization QID  . methylPREDNISolone (SOLU-MEDROL) injection  40 mg Intravenous Daily  . sodium chloride flush  3 mL Intravenous Q12H   Continuous Infusions: . sodium chloride 75 mL/hr at 07/13/16 0811    Assessment/Plan:  1. Acute respiratory failure with hypoxia. Patient with poor air entry. Pulse ox 88% early morning on room air. Patient on nasal cannula. Will get venous blood gas to see if BiPAP is needed.  The patient did not want me to reverse the dilaudid. The patient's wife is currently at the bedside and will watch him closely on his respiratory status and try to keep him awake. Will get pulmonary consult and may need to be monitored more closely in the ICU. 2. Clinical sepsis present on admission with fever, tachycardia and leukocytosis and multifocal pneumonia seen on CT scan of the chest. Patient on Rocephin and Zithromax. So far blood cultures are negative. Continue nebulizer treatments. Add budesonide nebulizers 3. Pleuritic chest pain. Stop IV Dilaudid pain medication secondary to altered mental status. Oral pain medication if needed. Decrease steroid dose down to daily dosing.  Code Status:     Code Status Orders        Start     Ordered   07/11/16 2001  Full code  Continuous     07/11/16 2000    Code Status History    Date Active Date Inactive  Code Status Order ID Comments User Context   This patient has a current code status but no historical code status.     Family Communication: Wife at the bedside Disposition Plan: To be determined  Antibiotics:  Rocephin  Zithromax  Time spent: 35 minutes. Patient right now is critically ill and needs close monitoring of respiratory status. Pulmonary to call me back in a few minutes.  Alford Highland  [employer]

## 2016-07-13 NOTE — Care Management (Signed)
Admitted to [facility] with the diagnosis of systemic inflammation response syndrome. Lives with wife, Lanora Manis [phone]). [employer] as Media planner. Works at the [employer] and works part-time at [employer]. Takes care of all basic activities of daily living himself, drives. No falls. Food appetite. No primary care physician listed. Spoke with wife at the bedside. Discussed physician's list and hard copy given to wife. Transferred to ICU for closer monitoring, Gwenette Greet RN MSN CCM Care Management [phone]

## 2016-07-13 NOTE — Progress Notes (Signed)
Patient ID: Jesus Trujillo, male   DOB: [birth year], 36 y.o.   MRN: [id number] Case discussed with DR Sung Amabile critical care specialist. Will transfer to ICU for closer monitoring.  Dr Renae Gloss

## 2016-07-13 NOTE — Consult Note (Signed)
PULMONARY / CRITICAL CARE MEDICINE   Name: Jesus Trujillo MRN: [id number] DOB: 1980/08/20    ADMISSION DATE:  07/11/2016 CONSULTATION DATE:  07/13/16  REFERRING MD:  Hilton Sinclair  PT PROFILE: 50 M never smoker admitted 04/14 with severe L sided pleurodynia, fever and L lung infiltrate on CXR. Transferred to ICU/SDU for increasing pain and severe lethargy after hydromorphone  HISTORY OF PRESENT ILLNESS:   As above. At the time of my evaluation, he is in not overtly dyspneic but he does have splinting respiratory pattern and appears in pain.   PAST MEDICAL HISTORY :  He  has no past medical history on file.  PAST SURGICAL HISTORY: He  has no past surgical history on file.  No Known Allergies  No current facility-administered medications on file prior to encounter.    No current outpatient prescriptions on file prior to encounter.    FAMILY HISTORY:  His has no family status information on file.    SOCIAL HISTORY: He  reports that he has never smoked. He has never used smokeless tobacco. He reports that he drinks alcohol.  REVIEW OF SYSTEMS:   Except severe pain and fever, a detailed ROS is negative  SUBJECTIVE:    VITAL SIGNS: BP 122/69 (BP Location: Right Arm)   Pulse (!) 102   Temp 98.9 F (37.2 C) (Oral)   Resp (!) 30   Ht  (1.803 m)   Wt 126.3 kg (278 lb 8 oz)   SpO2 93%   BMI 38.84 kg/m   HEMODYNAMICS:    VENTILATOR SETTINGS:    INTAKE / OUTPUT: I/O last 3 completed shifts: In: 4165 [P.O.:240; I.V.:2625; IV Piggyback:1300] Out: 2700 [Urine:2700]  PHYSICAL EXAMINATION: General: Obese, appears to be in pain but no respiratory distress Neuro: Cranial nerves intact, motor and sensory intact, DTRs symmetric HEENT: NCAT, sclerae white Cardiovascular: Regular, no murmurs Lungs: Splinting pattern of respiration, diminished breath sounds on the left, no wheezes Abdomen: Obese, soft, bowel sounds present Extremities: Warm, no edema Skin: No  lesions noted on limited exam  LABS:  BMET  Recent Labs Lab 07/11/16 1515 07/12/16 0428  NA 137 136  K 3.8 4.5  CL 101 103  CO2 28 25  BUN 16 13  CREATININE 1.00 0.75  GLUCOSE 126* 144*    Electrolytes  Recent Labs Lab 07/11/16 1515 07/12/16 0428  CALCIUM 9.3 9.0    CBC  Recent Labs Lab 07/11/16 1515 07/12/16 0428  WBC 14.6* 18.0*  HGB 14.3 13.6  HCT 42.3 40.0  PLT 318 287    Coag's No results for input(s): APTT, INR in the last 168 hours.  Sepsis Markers  Recent Labs Lab 07/11/16 1801 07/11/16 2032  LATICACIDVEN 1.6 1.3    ABG  Recent Labs Lab 07/13/16 1050  PHART 7.44  PCO2ART 37  PO2ART 58*    Liver Enzymes  Recent Labs Lab 07/12/16 0428  AST 21  ALT 30  ALKPHOS 75  BILITOT 0.8  ALBUMIN 3.6    Cardiac Enzymes  Recent Labs Lab 07/11/16 1515  TROPONINI <0.03    Glucose  Recent Labs Lab 07/12/16 1919  GLUCAP 137*   Prior chest x-ray, CT scan of the chest and CT of the abdomen have been reviewed  CXR: Today's chest x-ray reveals increasing left pleural effusion   ASSESSMENT / PLAN:  Community-acquired pneumonia Left parapneumonic effusion Severe pleurodynia  Lethargy after hydromorphone  Continue ceftriaxone and azithromycin Streptococcal urinary antigen and Legionella urinary antigen ordered Pro-calcitonin protocol ordered Ketorolac  for pleuritic pain PRN Percocet - avoid more potent opioids I have requested thoracentesis to be performed by interventional radiology    Billy Fischer, MD PCCM service Mobile [phone] Pager [phone]  07/13/2016, 3:40 PM

## 2016-07-13 NOTE — Progress Notes (Signed)
Pt arouses upon voice stimulation. Very lethargic. Increased, shallow, respirations. On 2L O2 via [place]. Diminished course sounds in uppers and absent air exchange in left lower lobe noted. Incentive spirometer given to patient with teach back. Patient reached . Dry, non-productive cough noted. Unable to get sputum specimen at this time. Dr. Darrol Angel spoke with wife regarding consent for thoracentesis. Consent received and on chart. Continent of urine. Patient belching occasionally. Non-pitting edema noted to bilateral feet. + pedal pulses. Pain currently being controlled by Toradol.

## 2016-07-13 NOTE — Progress Notes (Signed)
Patient had severe left flank pain. Also oxygen saturation dropped and was put on oxygen via [place]. CT kidney stone protocol showed no stones.IV dilaudid prn for pain.F/U lipase level. Discussed medical condition and treatment plan with patient and family.

## 2016-07-14 ENCOUNTER — Inpatient Hospital Stay: Payer: BC Managed Care – PPO

## 2016-07-14 LAB — BODY FLUID CELL COUNT WITH DIFFERENTIAL
Eos, Fluid: 1 %
Lymphs, Fluid: 4 %
Monocyte-Macrophage-Serous Fluid: 17 %
NEUTROPHIL FLUID: 78 %
Total Nucleated Cell Count, Fluid: 14728 cu mm

## 2016-07-14 LAB — CBC
HEMATOCRIT: 42.1 % (ref 40.0–52.0)
Hemoglobin: 13.8 g/dL (ref 13.0–18.0)
MCH: 29 pg (ref 26.0–34.0)
MCHC: 32.8 g/dL (ref 32.0–36.0)
MCV: 88.4 fL (ref 80.0–100.0)
Platelets: 317 10*3/uL (ref 150–440)
RBC: 4.76 MIL/uL (ref 4.40–5.90)
RDW: 13.6 % (ref 11.5–14.5)
WBC: 20.8 10*3/uL — AB (ref 3.8–10.6)

## 2016-07-14 LAB — BASIC METABOLIC PANEL
ANION GAP: 10 (ref 5–15)
BUN: 16 mg/dL (ref 6–20)
CO2: 28 mmol/L (ref 22–32)
Calcium: 9.1 mg/dL (ref 8.9–10.3)
Chloride: 102 mmol/L (ref 101–111)
Creatinine, Ser: 0.88 mg/dL (ref 0.61–1.24)
GFR calc Af Amer: >60 mL/min (ref >60)
GLUCOSE: 106 mg/dL — AB (ref 65–99)
POTASSIUM: 4.1 mmol/L (ref 3.5–5.1)
Sodium: 140 mmol/L (ref 135–145)

## 2016-07-14 LAB — PROTEIN, PLEURAL OR PERITONEAL FLUID: Total protein, fluid: 5.1 g/dL

## 2016-07-14 LAB — RAPID HIV SCREEN (HIV 1/2 AB+AG)
HIV 1/2 ANTIBODIES: NONREACTIVE
HIV-1 P24 Antigen - HIV24: NONREACTIVE

## 2016-07-14 LAB — PROCALCITONIN: PROCALCITONIN: 0.11 ng/mL

## 2016-07-14 LAB — LACTATE DEHYDROGENASE, PLEURAL OR PERITONEAL FLUID: LD, Fluid: 1356 U/L — ABNORMAL HIGH (ref 3–23)

## 2016-07-14 MED ORDER — ALUM & MAG HYDROXIDE-SIMETH 200-200-20 MG/5ML PO SUSP
15.0000 mL | ORAL | Status: DC | PRN
  Administered 2016-07-14: 15 mL via ORAL
  Filled 2016-07-14: qty 30

## 2016-07-14 MED ORDER — IPRATROPIUM-ALBUTEROL 0.5-2.5 (3) MG/3ML IN SOLN
3.0000 mL | Freq: Four times a day (QID) | RESPIRATORY_TRACT | Status: DC
  Administered 2016-07-14 – 2016-07-15 (×3): 3 mL via RESPIRATORY_TRACT
  Filled 2016-07-14 (×3): qty 3

## 2016-07-14 NOTE — Progress Notes (Signed)
Pt alert and able to make needs known. Pt on 2L O2 via [place], sating in mid-90s. No pain complaints until mid-morning when he complained of sharp left-sided pain. Percocet given, effective for relief. Wife at bedside. No edema noted this morning. Thoracentesis completed at 1230 by Dr. Richarda Overlie.  of amber colored fluid pulled off of patient. Minimal blood loss and no complications per MD. Patient states the insertion site is sore but MD told patient it will be sore for approximately an hour. No further complaints. Report called to Med/Surg. Patient will be going to the unit with a telemetry monitor and will be having a STAT CXR.

## 2016-07-14 NOTE — Progress Notes (Signed)
Pain is much improved. No new complaints. No distress.   Vitals:   07/14/16 0500 07/14/16 0600 07/14/16 0700 07/14/16 0800  BP: (!) 101/55 96/62 (!) 105/53 103/63  Pulse:      Resp: (!) 21 15 (!) 23 (!) 29  Temp:    98.6 F (37 C)  TempSrc:    Oral  SpO2: 91% 93% 95% 93%  Weight:      Height:       2 LPM [place]  PHYSICAL EXAMINATION: General: Obese Neuro: Cranial nerves intact, motor and sensory intact, DTRs symmetric HEENT: NCAT, sclerae white Cardiovascular: Regular, no murmurs Lungs: Bronchial breath sounds on the left, no wheezes Abdomen: Obese, soft, bowel sounds present Extremities: Warm, no edema Skin: No lesions noted on limited exam  BMP Latest Ref Rng & Units 07/14/2016 07/12/2016 07/11/2016  Glucose 65 - 99 mg/dL 161(W) 960(A) 540(J)  BUN 6 - 20 mg/dL Creatinine 0.61 - 1.24 mg/dL 8.11 9.14 7.82  Sodium 135 - 145 mmol/L 140 136 137  Potassium 3.5 - 5.1 mmol/L 4.1 4.5 3.8  Chloride 101 - 111 mmol/L 102 103 101  CO2 22 - 32 mmol/L Calcium 8.9 - 10.3 mg/dL 9.1 9.0 9.3   CBC Latest Ref Rng & Units 07/14/2016 07/12/2016 07/11/2016  WBC 3.8 - 10.6 K/uL 20.8(H) 18.0(H) 14.6(H)  Hemoglobin 13.0 - 18.0 g/dL 95.6 21.3 08.6  Hematocrit 40.0 - 52.0 % 42.1 40.0 42.3  Platelets 150 - 440 K/uL 317 287 318   PCT 0.11, 0.11 Strep urinary Ag negative  CXR yesterday revealed large R effusion  IMPRESSION: Community-acquired pneumonia Left parapneumonic effusion Severe pleurodynia - much improved with kertorolac  PLAN/REC: Cont ceftriaxone/Azithro for now - will likely be able to change to PO levofloxacin in the next 24-48 hrs and complete 10 days total  Thoracentesis to be performed today - labs on fluid ordered  Transfer to med-surg ordered  I will follow up after pleural fluid results are available  Billy Fischer, MD PCCM service Mobile [phone] Pager [phone] 07/14/2016

## 2016-07-14 NOTE — Progress Notes (Signed)
Spoke with Dr Darnelle Catalan. Parameters for O2 changed to above 88%. Aware of patient being in the low 100's on the cardiac monitor. Called Tiffany to report to her. Patient will be on cardiac monitor.

## 2016-07-14 NOTE — Progress Notes (Signed)
Patient ID: JASN XIA, male   DOB: 18-Nov-1980, 36 y.o.   MRN: [id number]   [facility] PROGRESS NOTE  Jesus Trujillo WUJ:[id number] DOB: [birth year] DOA: 07/11/2016 PCP: Particia Nearing, PA-C  HPI/Subjective: much more awake and feeling better  Objective: Vitals:   07/14/16 1400 07/14/16 1502  BP: 111/65 129/75  Pulse:  (!) 115  Resp: (!) 32 (!) 26  Temp:  98.4 F (36.9 C)    Filed Weights   07/11/16 2012 07/12/16 0411 07/13/16 0500  Weight: 124.4 kg (274 lb 3.2 oz) 124.4 kg (274 lb 3.2 oz) 126.3 kg (278 lb 8 oz)    ROS: Review of Systems  Constitutional: Negative for chills, fever and weight loss.  HENT: Negative for nosebleeds and sore throat.   Eyes: Negative for blurred vision.  Respiratory: Negative for cough, shortness of breath and wheezing.   Cardiovascular: Negative for chest pain, orthopnea, leg swelling and PND.  Gastrointestinal: Negative for abdominal pain, constipation, diarrhea, heartburn, nausea and vomiting.  Genitourinary: Negative for dysuria and urgency.  Musculoskeletal: Negative for back pain.  Skin: Negative for rash.  Neurological: Negative for dizziness, speech change, focal weakness and headaches.  Endo/Heme/Allergies: Does not bruise/bleed easily.  Psychiatric/Behavioral: Negative for depression.   Exam: Physical Exam  Constitutional: He is oriented to person, place, and time.  HENT:  Nose: No mucosal edema.  Mouth/Throat: No oropharyngeal exudate or posterior oropharyngeal edema.  Eyes: Conjunctivae, EOM and lids are normal. Pupils are equal, round, and reactive to light.  Neck: No JVD present. Carotid bruit is not present. No edema present. No thyroid mass and no thyromegaly present.  Cardiovascular: S1 normal and S2 normal.  Exam reveals no gallop.   No murmur heard. Pulses:      Dorsalis pedis pulses are 2+ on the right side, and 2+ on the left side.  Respiratory: No respiratory distress. He has no decreased  breath sounds. He has no wheezes. He has no rhonchi. He has no rales.  GI: Soft. Bowel sounds are normal. There is no tenderness.  Musculoskeletal:       Right ankle: He exhibits no swelling.       Left ankle: He exhibits no swelling.  Lymphadenopathy:    He has no cervical adenopathy.  Neurological: He is alert and oriented to person, place, and time. No cranial nerve deficit.  Skin: Skin is warm. No rash noted. Nails show no clubbing.  Psychiatric: He has a normal mood and affect.      Data Reviewed: Basic Metabolic Panel:  Recent Labs Lab 07/11/16 1515 07/12/16 0428 07/14/16 0310  NA 137 136 140  K 3.8 4.5 4.1  CL 101 103 102  CO2 GLUCOSE 126* 144* 106*  BUN CREATININE 1.00 0.75 0.88  CALCIUM 9.3 9.0 9.1   Liver Function Tests:  Recent Labs Lab 07/12/16 0428  AST 21  ALT 30  ALKPHOS 75  BILITOT 0.8  PROT 8.0  ALBUMIN 3.6   CBC:  Recent Labs Lab 07/11/16 1515 07/12/16 0428 07/14/16 0310  WBC 14.6* 18.0* 20.8*  HGB 14.3 13.6 13.8  HCT 42.3 40.0 42.1  MCV 86.3 88.1 88.4  PLT 318 287 317   Cardiac Enzymes:  Recent Labs Lab 07/11/16 1515  TROPONINI <0.03     Recent Results (from the past 240 hour(s))  Blood Culture (routine x 2)     Status: None (Preliminary result)   Collection Time: 07/11/16  6:01 PM  Result Value Ref Range Status   Specimen Description BLOOD LEFT HAND  Final   Special Requests   Final    BOTTLES DRAWN AEROBIC AND ANAEROBIC Blood Culture adequate volume   Culture NO GROWTH 3 DAYS  Final   Report Status PENDING  Incomplete  Blood Culture (routine x 2)     Status: None (Preliminary result)   Collection Time: 07/11/16  6:01 PM  Result Value Ref Range Status   Specimen Description BLOOD RIGHT ARM  Final   Special Requests   Final    BOTTLES DRAWN AEROBIC AND ANAEROBIC Blood Culture results may not be optimal due to an excessive volume of blood received in culture bottles   Culture NO GROWTH 3 DAYS  Final    Report Status PENDING  Incomplete  MRSA PCR Screening     Status: None   Collection Time: 07/13/16  1:35 PM  Result Value Ref Range Status   MRSA by PCR NEGATIVE NEGATIVE Final    Comment:        The GeneXpert MRSA Assay (FDA approved for NASAL specimens only), is one component of a comprehensive MRSA colonization surveillance program. It is not intended to diagnose MRSA infection nor to guide or monitor treatment for MRSA infections.   Body fluid culture     Status: None (Preliminary result)   Collection Time: 07/14/16 12:15 PM  Result Value Ref Range Status   Specimen Description PLEURAL  Final   Special Requests NONE  Final   Gram Stain   Final    MODERATE WBC PRESENT, PREDOMINANTLY PMN NO ORGANISMS SEEN Performed at [facility], [address] [place], [place] [address]    Culture PENDING  Incomplete   Report Status PENDING  Incomplete     Studies: Dg Chest 1 View  Result Date: 07/14/2016 CLINICAL DATA:  Status post left-sided thoracentesis EXAM: CHEST 1 VIEW COMPARISON:  Portable chest x-ray of 13 July 2016 FINDINGS: There remains considerable volume loss on the left with only a small amount of aerated upper lobe visible. The pleural effusion volume has decreased slightly. The right lung is well-expanded. There is no pneumothorax. The cardiac silhouette is enlarged but the left heart border is obscured. The pulmonary vascularity is not engorged. IMPRESSION: Slight interval decrease in the volume of the pleural effusion on the left. No postprocedure pneumothorax. Electronically Signed   By: Enzo  [place] M.D.   On: 07/14/2016 12:46   Dg Chest Port 1 View  Result Date: 07/13/2016 CLINICAL DATA:  Short of breath.  Weakness and chest pain EXAM: PORTABLE CHEST 1 VIEW COMPARISON:  07/11/2016 FINDINGS: Cardiac enlargement is again noted. There is a large left pleural effusion. A small amount of aerated lung is identified within the left apex. Right lung appears clear.  IMPRESSION: 1. Large left pleural effusion. Electronically Signed   By: Signa Kell M.D.   On: 07/13/2016 15:08   Ct Renal Stone Study  Result Date: 07/13/2016 CLINICAL DATA:  Left flank pain EXAM: CT ABDOMEN AND PELVIS WITHOUT CONTRAST TECHNIQUE: Multidetector CT imaging of the abdomen and pelvis was performed following the standard protocol without IV contrast. COMPARISON:  Chest CT 07/11/2016 FINDINGS: Lower chest: There is consolidation of the left lower lobe with a small pleural effusion. Hepatobiliary: Normal noncontrast appearance of the liver. No visible biliary dilatation. Normal gallbladder. Pancreas: Normal noncontrast appearance of the pancreas. No peripancreatic fluid collection. Spleen: Normal. Adrenal glands: Normal. Urinary Tract: --Right kidney/ureter: No hydronephrosis or perinephric stranding. No nephrolithiasis.  No obstructing ureteral stones. --Left kidney/ureter: No hydronephrosis or perinephric stranding. No nephrolithiasis. No obstructing ureteral stones. --Urinary bladder: Unremarkable. Stomach/Bowel: No dilated loops of bowel. No evidence of colonic or enteric inflammation. No fluid collection within the abdomen. Vascular/Lymphatic: No abdominal aortic aneurysm or atherosclerotic calcification. No abdominal or pelvic lymphadenopathy. Reproductive: Normal prostate and seminal vesicles. Musculoskeletal. No focal osseous lesion. Normal visualized extraperitoneal and extrathoracic soft tissues. IMPRESSION: 1. No obstructive uropathy or nephrolithiasis. 2. Left lower lobe consolidation and small pleural effusion, unchanged from recent chest CT and again likely indicating pneumonia. Electronically Signed   By: Deatra Robinson M.D.   On: 07/13/2016 03:17   US Thoracentesis Asp Pleural Space W/img Guide  Result Date: 07/14/2016 INDICATION: Community acquired pneumonia with the left pleural effusion. Request for a diagnostic thoracentesis. EXAM: ULTRASOUND GUIDED LEFT THORACENTESIS  MEDICATIONS: None. COMPLICATIONS: None immediate. PROCEDURE: An ultrasound guided thoracentesis was thoroughly discussed with the patient and questions answered. The benefits, risks, alternatives and complications were also discussed. The patient understands and wishes to proceed with the procedure. Written consent was obtained. Ultrasound was performed to localize and mark an adequate pocket of fluid in the left chest. The area was then prepped and draped in the normal sterile fashion. 1% Lidocaine was used for local anesthesia. Under ultrasound guidance a 6 Fr Safe-T-Centesis catheter was introduced. Thoracentesis was performed. The catheter was removed and a dressing applied. FINDINGS: A total of approximately 550 mL of amber colored fluid was removed. Samples were sent to the laboratory as requested by the clinical team. IMPRESSION: Successful ultrasound guided left thoracentesis yielding 550 mL of pleural fluid. Electronically Signed   By: Richarda Overlie M.D.   On: 07/14/2016 13:11    Scheduled Meds: . budesonide (PULMICORT) nebulizer solution  0.5 mg Nebulization BID  . docusate sodium  100 mg Oral BID  . heparin  5,000 Units Subcutaneous Q8H  . ipratropium-albuterol  3 mL Inhalation Q6H  . sodium chloride flush  3 mL Intravenous Q12H   Continuous Infusions: . azithromycin    . cefTRIAXone (ROCEPHIN)  IV      Assessment/Plan:  1. Acute respiratory failure with hypoxia. Due to pneumonia and parapneumonic effusion. s/p thora  2. Clinical sepsis: due to pna. Improving with abx  3. Lt parapneumonic effusion: s/p thora and 550 ml of fluid, wean O2 as able 4. Pleuritic chest pain: avoid narcotics, d/w patient and family.  Patient interested in modified work duty and I have requested to him see his Occupational physician from work - I will give him work excuse on discharge and he can f/up with them/PCP to decide long term work change.  Can transfer to any med-surg  Code Status: FULL  CODE  Family Communication: Wife at the bedside Disposition Plan: in 1-2 days depending on clinical condition  Antibiotics:  Rocephin  Zithromax  Time spent: 35 minutes.   Tayvia Faughnan [employer]

## 2016-07-14 NOTE — Procedures (Signed)
[place] left thoracentesis.  Removed 550 ml of amber colored fluid.  Minimal blood loss and no immediate complication.

## 2016-07-15 ENCOUNTER — Telehealth: Payer: Self-pay | Admitting: Pulmonary Disease

## 2016-07-15 DIAGNOSIS — J189 Pneumonia, unspecified organism: Secondary | ICD-10-CM

## 2016-07-15 LAB — CBC
HEMATOCRIT: 38.1 % — AB (ref 40.0–52.0)
HEMOGLOBIN: 12.7 g/dL — AB (ref 13.0–18.0)
MCH: 29 pg (ref 26.0–34.0)
MCHC: 33.4 g/dL (ref 32.0–36.0)
MCV: 87 fL (ref 80.0–100.0)
Platelets: 323 10*3/uL (ref 150–440)
RBC: 4.38 MIL/uL — AB (ref 4.40–5.90)
RDW: 13.6 % (ref 11.5–14.5)
WBC: 17.8 10*3/uL — ABNORMAL HIGH (ref 3.8–10.6)

## 2016-07-15 LAB — BASIC METABOLIC PANEL
Anion gap: 8 (ref 5–15)
BUN: 15 mg/dL (ref 6–20)
CO2: 24 mmol/L (ref 22–32)
CREATININE: 0.67 mg/dL (ref 0.61–1.24)
Calcium: 8.5 mg/dL — ABNORMAL LOW (ref 8.9–10.3)
Chloride: 104 mmol/L (ref 101–111)
GFR calc Af Amer: >60 mL/min (ref >60)
GFR calc non Af Amer: >60 mL/min (ref >60)
Glucose, Bld: 91 mg/dL (ref 65–99)
POTASSIUM: 3.7 mmol/L (ref 3.5–5.1)
Sodium: 136 mmol/L (ref 135–145)

## 2016-07-15 LAB — PROCALCITONIN: Procalcitonin: 0.11 ng/mL

## 2016-07-15 LAB — LEGIONELLA PNEUMOPHILA SEROGP 1 UR AG: L. pneumophila Serogp 1 Ur Ag: NEGATIVE

## 2016-07-15 LAB — CYTOLOGY - NON PAP

## 2016-07-15 MED ORDER — IPRATROPIUM-ALBUTEROL 20-100 MCG/ACT IN AERS: 1.0000 | INHALATION_SPRAY | Freq: Four times a day (QID) | RESPIRATORY_TRACT | 0 refills | Status: DC | PRN

## 2016-07-15 MED ORDER — AMOXICILLIN-POT CLAVULANATE 875-125 MG PO TABS: 1.0000 | ORAL_TABLET | Freq: Two times a day (BID) | ORAL | 0 refills | Status: DC

## 2016-07-15 MED ORDER — LEVOFLOXACIN 750 MG PO TABS: 750.0000 mg | ORAL_TABLET | Freq: Every day | ORAL | 0 refills | Status: DC

## 2016-07-15 NOTE — Telephone Encounter (Signed)
Pt needs 1 week hosp f/u. Please advise of date and time.

## 2016-07-15 NOTE — Progress Notes (Signed)
[facility] - [place] at [facility]        Ruxin Ransome was admitted to the Hospital on 07/11/2016 and Discharged  07/15/2016 and should be excused from work  for 8 days starting 07/11/2016 , may return to work without any restrictions 07/20/2016.  Delfino Lovett M.D on 07/15/2016,at 9:26 AM  [facility] -  at [facility]  [phone]

## 2016-07-15 NOTE — Discharge Instructions (Signed)
Community-Acquired Pneumonia, Adult °Pneumonia is an infection of the lungs. One type of pneumonia can happen while a person is in a hospital. A different type can happen when a person is not in a hospital (community-acquired pneumonia). It is easy for this kind to spread from person to person. It can spread to you if you breathe near an infected person who coughs or sneezes. Some symptoms include: °· A dry cough. °· A wet (productive) cough. °· Fever. °· Sweating. °· Chest pain. °Follow these instructions at home: °· Take over-the-counter and prescription medicines only as told by your doctor. °¨ Only take cough medicine if you are losing sleep. °¨ If you were prescribed an antibiotic medicine, take it as told by your doctor. Do not stop taking the antibiotic even if you start to feel better. °· Sleep with your head and neck raised (elevated). You can do this by putting a few pillows under your head, or you can sleep in a recliner. °· Do not use tobacco products. These include cigarettes, chewing tobacco, and e-cigarettes. If you need help quitting, ask your doctor. °· Drink enough water to keep your pee (urine) clear or pale yellow. °A shot (vaccine) can help prevent pneumonia. Shots are often suggested for: °· People older than 34 years of age. °· People older than 34 years of age: °¨ Who are having cancer treatment. °¨ Who have long-term (chronic) lung disease. °¨ Who have problems with their body's defense system (immune system). °You may also prevent pneumonia if you take these actions: °· Get the flu (influenza) shot every year. °· Go to the dentist as often as told. °· Wash your hands often. If soap and water are not available, use hand sanitizer. °Contact a doctor if: °· You have a fever. °· You lose sleep because your cough medicine does not help. °Get help right away if: °· You are short of breath and it gets worse. °· You have more chest pain. °· Your sickness gets worse. This is very serious if: °¨ You  are an older adult. °¨ Your body's defense system is weak. °· You cough up blood. °This information is not intended to replace advice given to you by your health care provider. Make sure you discuss any questions you have with your health care provider. °Document Released: 09/02/2007 Document Revised: 08/22/2015 Document Reviewed: 07/11/2014 °Elsevier Interactive Patient Education © 2017 [employer] ° °

## 2016-07-15 NOTE — Telephone Encounter (Signed)
Per pulmonary docs, pt needs 2-4 week f/u appt. Schedule pt for 08/05/16 @ 9:15am with Sim[place] in 30 minute slot and advise pt to get CXR prior to appt. Order placed.

## 2016-07-15 NOTE — Progress Notes (Signed)
Patient discharge teaching given, including activity, diet, follow-up appoints, and medications. Patient verbalized understanding of all discharge instructions. IV access was d/c'd. Vitals are stable. Skin is intact except as charted in most recent assessments. Pt to be escorted out by NT, to be driven home by family.  Haruko Mersch  

## 2016-07-16 ENCOUNTER — Ambulatory Visit (INDEPENDENT_AMBULATORY_CARE_PROVIDER_SITE_OTHER): Payer: BC Managed Care – PPO | Admitting: Family Medicine

## 2016-07-16 ENCOUNTER — Encounter: Payer: Self-pay | Admitting: Family Medicine

## 2016-07-16 VITALS — HR 97 | Temp 98.5°F | Ht 71.26 in | Wt 265.0 lb

## 2016-07-16 DIAGNOSIS — Z7689 Persons encountering health services in other specified circumstances: Secondary | ICD-10-CM | POA: Diagnosis not present

## 2016-07-16 DIAGNOSIS — M722 Plantar fascial fibromatosis: Secondary | ICD-10-CM

## 2016-07-16 DIAGNOSIS — J189 Pneumonia, unspecified organism: Secondary | ICD-10-CM | POA: Diagnosis not present

## 2016-07-16 LAB — CULTURE, BLOOD (ROUTINE X 2)
Culture: NO GROWTH
Culture: NO GROWTH
SPECIAL REQUESTS: ADEQUATE

## 2016-07-16 NOTE — Patient Instructions (Signed)
Follow up for complete physical exam

## 2016-07-16 NOTE — Progress Notes (Signed)
Pulse 97   Temp 98.5 F (36.9 C) (Oral)   Ht 5' 11.26" (1.81 m)   Wt 265 lb (120.2 kg)   SpO2 98%   BMI 36.69 kg/m    Subjective:    Patient ID: Jesus Trujillo, male    DOB: 03-12-1981, 36 y.o.   MRN: [id number]  HPI: Jesus Trujillo is a 36 y.o. male  Chief Complaint  Patient presents with  . New Patient (Initial Visit)  . Hospitalization Follow-up    pneumo.    Patient presents today to establish care. Was hospitalized this week for severe pneumonia and spent 2 days in the ICU. Was discharged yesterday on PO levaquin and combivent respimat and doing fairly well. Had fluid drawn off left lung and sent for testing, cx's came back negative. Still having some brief episodes of SOB, especially when waking with coughing fits in the night, but notes the inhaler helps a great deal. Denies fevers, productive cough, or significant/prolonged SOB since d/c. Notes and testing available from hospitalization personally reviewed.   Otherwise, has been very healthy all his life. No known medical issues. Does have occasional bouts of plantar fasciitis related to wearing heavy work boots and being on his feet 12 hrs at a time working. Also has occasional HAs relieved by prn ibuprofen. Not on any regular prescription or OTC medications.   Has not had a CPE for several years, but does get annual work physicals done for his job.   Relevant past medical, surgical, family and social history reviewed and updated as indicated. Interim medical history since our last visit reviewed. Allergies and medications reviewed and updated.  Review of Systems  Constitutional: Negative.   HENT: Negative.   Eyes: Negative.   Respiratory: Positive for cough and shortness of breath.   Cardiovascular: Negative.   Gastrointestinal: Negative.   Genitourinary: Negative.   Musculoskeletal: Negative.   Neurological: Negative.   Psychiatric/Behavioral: Negative.     Per HPI unless specifically indicated  above     Objective:    Pulse 97   Temp 98.5 F (36.9 C) (Oral)   Ht 5' 11.26" (1.81 m)   Wt 265 lb (120.2 kg)   SpO2 98%   BMI 36.69 kg/m   Wt Readings from Last 3 Encounters:  07/16/16 265 lb (120.2 kg)  07/15/16 277 lb 1.6 oz (125.7 kg)    Physical Exam  Constitutional: He is oriented to person, place, and time. He appears well-developed and well-nourished. No distress.  HENT:  Head: Atraumatic.  Eyes: Conjunctivae are normal. Pupils are equal, round, and reactive to light. No scleral icterus.  Neck: Normal range of motion. Neck supple.  Cardiovascular: Normal rate and normal heart sounds.   Pulmonary/Chest: Effort normal. No respiratory distress.  Slight crackles in left lower lung field near site of needle entry  Musculoskeletal: Normal range of motion.  Neurological: He is alert and oriented to person, place, and time.  Skin: Skin is warm and dry.  Psychiatric: He has a normal mood and affect. His behavior is normal.  Nursing note and vitals reviewed.   Results for orders placed or performed during the hospital encounter of 07/11/16  Blood Culture (routine x 2)  Result Value Ref Range   Specimen Description BLOOD LEFT HAND    Special Requests      BOTTLES DRAWN AEROBIC AND ANAEROBIC Blood Culture adequate volume   Culture NO GROWTH 5 DAYS    Report Status 07/16/2016 FINAL   Blood Culture (routine  x 2)  Result Value Ref Range   Specimen Description BLOOD RIGHT ARM    Special Requests      BOTTLES DRAWN AEROBIC AND ANAEROBIC Blood Culture results may not be optimal due to an excessive volume of blood received in culture bottles   Culture NO GROWTH 5 DAYS    Report Status 07/16/2016 FINAL   MRSA PCR Screening  Result Value Ref Range   MRSA by PCR NEGATIVE NEGATIVE  Body fluid culture  Result Value Ref Range   Specimen Description PLEURAL    Special Requests NONE    Gram Stain      MODERATE WBC PRESENT, PREDOMINANTLY PMN NO ORGANISMS SEEN    Culture       NO GROWTH 2 DAYS Performed at [facility], [address] [place], [place] [address]    Report Status PENDING   Basic metabolic panel  Result Value Ref Range   Sodium 137 135 - 145 mmol/L   Potassium 3.8 3.5 - 5.1 mmol/L   Chloride 101 101 - 111 mmol/L   CO2 28 22 - 32 mmol/L   Glucose, Bld 126 (H) 65 - 99 mg/dL   BUN 16 6 - 20 mg/dL   Creatinine, Ser 6.04 0.61 - 1.24 mg/dL   Calcium 9.3 8.9 - 54.0 mg/dL   GFR calc non Af Amer >60 >60 mL/min   GFR calc Af Amer >60 >60 mL/min   Anion gap 8 5 - 15  CBC  Result Value Ref Range   WBC 14.6 (H) 3.8 - 10.6 K/uL   RBC 4.90 4.40 - 5.90 MIL/uL   Hemoglobin 14.3 13.0 - 18.0 g/dL   HCT 98.1 19.1 - 47.8 %   MCV 86.3 80.0 - 100.0 fL   MCH 29.2 26.0 - 34.0 pg   MCHC 33.9 32.0 - 36.0 g/dL   RDW 29.5 62.1 - 30.8 %   Platelets 318 150 - 440 K/uL  Troponin I  Result Value Ref Range   Troponin I <0.03 <0.03 ng/mL  Lactic acid, plasma  Result Value Ref Range   Lactic Acid, Venous 1.6 0.5 - 1.9 mmol/L  Lactic acid, plasma  Result Value Ref Range   Lactic Acid, Venous 1.3 0.5 - 1.9 mmol/L  HIV antibody (Routine Testing)  Result Value Ref Range   HIV Screen 4th Generation wRfx Non Reactive Non Reactive  Comprehensive metabolic panel  Result Value Ref Range   Sodium 136 135 - 145 mmol/L   Potassium 4.5 3.5 - 5.1 mmol/L   Chloride 103 101 - 111 mmol/L   CO2 25 22 - 32 mmol/L   Glucose, Bld 144 (H) 65 - 99 mg/dL   BUN 13 6 - 20 mg/dL   Creatinine, Ser 6.57 0.61 - 1.24 mg/dL   Calcium 9.0 8.9 - 84.6 mg/dL   Total Protein 8.0 6.5 - 8.1 g/dL   Albumin 3.6 3.5 - 5.0 g/dL   AST 21 15 - 41 U/L   ALT 30 17 - 63 U/L   Alkaline Phosphatase 75 38 - 126 U/L   Total Bilirubin 0.8 0.3 - 1.2 mg/dL   GFR calc non Af Amer >60 >60 mL/min   GFR calc Af Amer >60 >60 mL/min   Anion gap 8 5 - 15  CBC  Result Value Ref Range   WBC 18.0 (H) 3.8 - 10.6 K/uL   RBC 4.55 4.40 - 5.90 MIL/uL   Hemoglobin 13.6 13.0 - 18.0 g/dL   HCT 96.2 95.2 - 84.1 %  MCV 88.1 80.0 - 100.0 fL   MCH 29.8 26.0 - 34.0 pg   MCHC 33.8 32.0 - 36.0 g/dL   RDW 16.1 09.6 - 04.5 %   Platelets 287 150 - 440 K/uL  Glucose, capillary  Result Value Ref Range   Glucose-Capillary 137 (H) 65 - 99 mg/dL  Lipase, blood  Result Value Ref Range   Lipase 14 11 - 51 U/L  Blood gas, arterial  Result Value Ref Range   FIO2 0.32    Delivery systems NASAL CANNULA    pH, Arterial 7.44 7.350 - 7.450   pCO2 arterial 37 32.0 - 48.0 mmHg   pO2, Arterial 58 (L) 83.0 - 108.0 mmHg   Bicarbonate 25.1 20.0 - 28.0 mmol/L   Acid-Base Excess 1.2 0.0 - 2.0 mmol/L   O2 Saturation 90.8 %   Patient temperature 37.0    Collection site LEFT RADIAL    Sample type ARTERIAL DRAW    Allens test (pass/fail) PASS PASS  Strep pneumoniae urinary antigen  Result Value Ref Range   Strep Pneumo Urinary Antigen NEGATIVE NEGATIVE  Legionella Pneumophila Serogp 1 Ur Ag  Result Value Ref Range   L. pneumophila Serogp 1 Ur Ag Negative Negative   Source of Sample URINE, RANDOM   Procalcitonin - Baseline  Result Value Ref Range   Procalcitonin 0.11 ng/mL  CBC  Result Value Ref Range   WBC 20.8 (H) 3.8 - 10.6 K/uL   RBC 4.76 4.40 - 5.90 MIL/uL   Hemoglobin 13.8 13.0 - 18.0 g/dL   HCT 40.9 81.1 - 91.4 %   MCV 88.4 80.0 - 100.0 fL   MCH 29.0 26.0 - 34.0 pg   MCHC 32.8 32.0 - 36.0 g/dL   RDW 78.2 95.6 - 21.3 %   Platelets 317 150 - 440 K/uL  Basic metabolic panel  Result Value Ref Range   Sodium 140 135 - 145 mmol/L   Potassium 4.1 3.5 - 5.1 mmol/L   Chloride 102 101 - 111 mmol/L   CO2 28 22 - 32 mmol/L   Glucose, Bld 106 (H) 65 - 99 mg/dL   BUN 16 6 - 20 mg/dL   Creatinine, Ser 0.86 0.61 - 1.24 mg/dL   Calcium 9.1 8.9 - 57.8 mg/dL   GFR calc non Af Amer >60 >60 mL/min   GFR calc Af Amer >60 >60 mL/min   Anion gap 10 5 - 15  Rapid HIV screen (HIV 1/2 Ab+Ag)  Result Value Ref Range   HIV-1 P24 Antigen - HIV24 NON REACTIVE NON REACTIVE   HIV 1/2 Antibodies NON REACTIVE NON REACTIVE    Interpretation (HIV Ag Ab)      A non reactive test result means that HIV 1 or HIV 2 antibodies and HIV 1 p24 antigen were not detected in the specimen.  Procalcitonin  Result Value Ref Range   Procalcitonin 0.11 ng/mL  Lactate dehydrogenase (pleural or peritoneal fluid)  Result Value Ref Range   LD, Fluid 1,356 (H) 3 - 23 U/L   Fluid Type-FLDH PLEURAL   Body fluid cell count with differential  Result Value Ref Range   Fluid Type-FCT PLEURAL    Color, Fluid YELLOW (A) YELLOW   Appearance, Fluid CLOUDY (A) CLEAR   WBC, Fluid 14,728 cu mm   Neutrophil Count, Fluid 78 %   Lymphs, Fluid 4 %   Monocyte-Macrophage-Serous Fluid 17 %   Eos, Fluid 1 %  Protein, pleural or peritoneal fluid  Result Value Ref Range   Total  protein, fluid 5.1 g/dL   Fluid Type-FTP PLEURAL   Procalcitonin  Result Value Ref Range   Procalcitonin 0.11 ng/mL  CBC  Result Value Ref Range   WBC 17.8 (H) 3.8 - 10.6 K/uL   RBC 4.38 (L) 4.40 - 5.90 MIL/uL   Hemoglobin 12.7 (L) 13.0 - 18.0 g/dL   HCT 16.1 (L) 09.6 - 04.5 %   MCV 87.0 80.0 - 100.0 fL   MCH 29.0 26.0 - 34.0 pg   MCHC 33.4 32.0 - 36.0 g/dL   RDW 40.9 81.1 - 91.4 %   Platelets 323 150 - 440 K/uL  Basic metabolic panel  Result Value Ref Range   Sodium 136 135 - 145 mmol/L   Potassium 3.7 3.5 - 5.1 mmol/L   Chloride 104 101 - 111 mmol/L   CO2 24 22 - 32 mmol/L   Glucose, Bld 91 65 - 99 mg/dL   BUN 15 6 - 20 mg/dL   Creatinine, Ser 7.82 0.61 - 1.24 mg/dL   Calcium 8.5 (L) 8.9 - 10.3 mg/dL   GFR calc non Af Amer >60 >60 mL/min   GFR calc Af Amer >60 >60 mL/min   Anion gap 8 5 - 15  Cytology - Non PAP;  Result Value Ref Range   CYTOLOGY - NON GYN      Cytology - Non PAP CASE: [id number] PATIENT: Pavle Hou Non-Gyn Cytology Report     SPECIMEN SUBMITTED: A. Pleural fluid, left  CLINICAL HISTORY: None Provided  PRE-OPERATIVE DIAGNOSIS: Left pneumonia and pleural effusion  POST-OPERATIVE DIAGNOSIS: None  provided.     DIAGNOSIS: A. PLEURAL FLUID, LEFT; ULTRASOUND-GUIDED THORACENTESIS: - NEGATIVE FOR MALIGNANCY. - ACUTE INFLAMMATION; VERY LARGE NUMBER OF NEUTROPHILS, SOME MACROPHAGES.  Comment: Slides reviewed: 1 cytospin slide, 1 ThinPrep slide, and 1 cell block   GROSS DESCRIPTION: A. Specimen Labeled: left pleural Volume: 550 mL Description: cloudy tan to brown fluid in a glass 1000 mL evacuated container Cell block(s): 1 and 1 ThinPrep  Final Diagnosis performed by Ronald Lobo, MD.  Electronically signed 07/15/2016 3:42:33PM    The electronic signature indicates that the named Attending Pathologist has evaluated the specimen  Technical component performed at [facility], [address] [place], [place] [address] Lab: [id number] Dir: Titus Dubin. Cato Mulligan, MD  Professional component performed at [facility], [facility], [address] [place], [place], [place] [address] Lab: [id number] Dir: Georgiann Cocker. Rubinas, MD        Assessment & Plan:   Problem List Items Addressed This Visit    None    Visit Diagnoses    Encounter to establish care    -  Primary   Community acquired pneumonia of left lung, unspecified part of lung       Resolving, complete PO levaquin and continue inhaler prn. F/u if worsening or no improvement. Return precautions given   Plantar fasciitis       Discussed inserts for work boots, orthotics for casual wear. Roll foot, epsom salt soaks. Ibuprofen prn       Follow up plan: Return for CPE.

## 2016-07-17 LAB — BODY FLUID CULTURE: CULTURE: NO GROWTH

## 2016-07-17 NOTE — Discharge Summary (Signed)
[facility] -  at [facility]   PATIENT NAME: Jesus Trujillo    MR#:  [id number]  DATE OF BIRTH:  08-13-1980  DATE OF ADMISSION:  07/11/2016   ADMITTING PHYSICIAN: Marguarite Arbour, MD  DATE OF DISCHARGE: 07/15/2016 10:59 AM  PRIMARY CARE PHYSICIAN: Particia Nearing, PA-C   ADMISSION DIAGNOSIS:  Sepsis, due to unspecified organism (HCC) [A41.9] Community acquired pneumonia of left lung, unspecified part of lung [J18.9] DISCHARGE DIAGNOSIS:  Principal Problem:   SIRS (systemic inflammatory response syndrome) (HCC) Active Problems:   Multifocal pneumonia   Pleurisy   Anxiety  SECONDARY DIAGNOSIS:   Past Medical History:  Diagnosis Date  . Anxiety   . History of pleurisy 06/2016  . Multifocal pneumonia 06/2016  . SIRS (systemic inflammatory response syndrome) (HCC) 06/2016   HOSPITAL COURSE:  1. Acute respiratory failure with hypoxia. Due to pneumonia and parapneumonic effusion. s/p thora and clinical improvement 2. Clinical sepsis: present on admission due to pna. Resolved with abx  3. Lt parapneumonic effusion: s/p thora and 550 ml of fluid, on room air now 4. Pleuritic chest pain: avoid narcotics, d/w patient and family. consdier NSAIDs  Patient interested in modified work duty and I have requested to him see his Occupational physician from work - I will give him work excuse on discharge and he can f/up with them/PCP to decide long term work change. DISCHARGE CONDITIONS:  stable CONSULTS OBTAINED:   DRUG ALLERGIES:  No Known Allergies DISCHARGE MEDICATIONS:   Allergies as of 07/15/2016   No Known Allergies     Medication List    TAKE these medications   Ipratropium-Albuterol 20-100 MCG/ACT Aers respimat Commonly known as:  COMBIVENT RESPIMAT Inhale 1 puff into the lungs every 6 (six) hours as needed for wheezing.   levofloxacin 750 MG tablet Commonly known as:  LEVAQUIN Take 1 tablet (750 mg total) by mouth daily.       DISCHARGE INSTRUCTIONS:   DIET:  Regular diet DISCHARGE CONDITION:  Good ACTIVITY:  Activity as tolerated OXYGEN:  Home Oxygen: No.  Oxygen Delivery: room air DISCHARGE LOCATION:  home   If you experience worsening of your admission symptoms, develop shortness of breath, life threatening emergency, suicidal or homicidal thoughts you must seek medical attention immediately by calling 911 or calling your MD immediately  if symptoms less severe.  You Must read complete instructions/literature along with all the possible adverse reactions/side effects for all the Medicines you take and that have been prescribed to you. Take any new Medicines after you have completely understood and accpet all the possible adverse reactions/side effects.   Please note  You were cared for by a hospitalist during your hospital stay. If you have any questions about your discharge medications or the care you received while you were in the hospital after you are discharged, you can call the unit and asked to speak with the hospitalist on call if the hospitalist that took care of you is not available. Once you are discharged, your primary care physician will handle any further medical issues. Please note that NO REFILLS for any discharge medications will be authorized once you are discharged, as it is imperative that you return to your primary care physician (or establish a relationship with a primary care physician if you do not have one) for your aftercare needs so that they can reassess your need for medications and monitor your lab values.    On the day of Discharge:  VITAL SIGNS:  Blood pressure 115/66, pulse 92, temperature 97.6 F (36.4 C), temperature source Oral, resp. rate 18, height  (1.803 m), weight 125.7 kg (277 lb 1.6 oz), SpO2 90 %. PHYSICAL EXAMINATION:  GENERAL:  36 y.o.-year-old patient lying in the bed with no acute distress.  EYES: Pupils equal, round, reactive to light and  accommodation. No scleral icterus. Extraocular muscles intact.  HEENT: Head atraumatic, normocephalic. Oropharynx and nasopharynx clear.  NECK:  Supple, no jugular venous distention. No thyroid enlargement, no tenderness.  LUNGS: Normal breath sounds bilaterally, no wheezing, rales,rhonchi or crepitation. No use of accessory muscles of respiration.  CARDIOVASCULAR: S1, S2 normal. No murmurs, rubs, or gallops.  ABDOMEN: Soft, non-tender, non-distended. Bowel sounds present. No organomegaly or mass.  EXTREMITIES: No pedal edema, cyanosis, or clubbing.  NEUROLOGIC: Cranial nerves II through XII are intact. Muscle strength 5/5 in all extremities. Sensation intact. Gait not checked.  PSYCHIATRIC: The patient is alert and oriented x 3.  SKIN: No obvious rash, lesion, or ulcer.  DATA REVIEW:   CBC  Recent Labs Lab 07/15/16 0554  WBC 17.8*  HGB 12.7*  HCT 38.1*  PLT 323    Chemistries   Recent Labs Lab 07/12/16 0428  07/15/16 0554  NA 136  < > 136  K 4.5  < > 3.7  CL 103  < > 104  CO2 25  < > 24  GLUCOSE 144*  < > 91  BUN 13  < > 15  CREATININE 0.75  < > 0.67  CALCIUM 9.0  < > 8.5*  AST 21  --   --   ALT 30  --   --   ALKPHOS 75  --   --   BILITOT 0.8  --   --   < > = values in this interval not displayed.    Management plans discussed with the patient, family and they are in agreement.  CODE STATUS: Prior   TOTAL TIME TAKING CARE OF THIS PATIENT: 45 minutes.    Delfino Lovett M.D on 07/17/2016 at 10:50 AM  Between 7am to 6pm - Pager - [phone]  After 6pm go to [email or web address] - password [email or web address] [facility]  [facility] [place] [facility]  Office  [phone]  CC: Primary care physician; Particia Nearing, PA-C   Note: This dictation was prepared with Dragon dictation along with smaller phrase technology. Any transcriptional errors that result from this process are unintentional.

## 2016-07-17 NOTE — Telephone Encounter (Signed)
Pt wife called back Confirmed time and day for apt.

## 2016-07-20 ENCOUNTER — Encounter: Payer: Self-pay | Admitting: Family Medicine

## 2016-07-20 ENCOUNTER — Telehealth: Payer: Self-pay | Admitting: Family Medicine

## 2016-07-20 NOTE — Telephone Encounter (Signed)
Spoke with pt's wife and she is requesting the work note to be extended through May 10th. Could you please generate the note and give it to Clydie Braun? She will be dropping off FMLA forms soon and I will let her know to pick it up then.

## 2016-07-20 NOTE — Telephone Encounter (Signed)
Letter generated, printed and given to Clydie Braun.

## 2016-08-05 ENCOUNTER — Ambulatory Visit
Admission: RE | Admit: 2016-08-05 | Discharge: 2016-08-05 | Disposition: A | Discharge status: Home / Self Care | Payer: BC Managed Care – PPO | Source: Ambulatory Visit | Attending: Pulmonary Disease | Admitting: Pulmonary Disease

## 2016-08-05 ENCOUNTER — Encounter: Payer: Self-pay | Admitting: Family Medicine

## 2016-08-05 ENCOUNTER — Encounter: Payer: Self-pay | Admitting: Pulmonary Disease

## 2016-08-05 ENCOUNTER — Ambulatory Visit (INDEPENDENT_AMBULATORY_CARE_PROVIDER_SITE_OTHER): Payer: BC Managed Care – PPO | Admitting: Pulmonary Disease

## 2016-08-05 ENCOUNTER — Other Ambulatory Visit: Payer: Self-pay | Admitting: Pulmonary Disease

## 2016-08-05 VITALS — BP 132/78 | HR 101 | Ht 71.0 in | Wt 258.0 lb

## 2016-08-05 DIAGNOSIS — J189 Pneumonia, unspecified organism: Secondary | ICD-10-CM

## 2016-08-05 DIAGNOSIS — J918 Pleural effusion in other conditions classified elsewhere: Principal | ICD-10-CM

## 2016-08-05 NOTE — Telephone Encounter (Signed)
I spoke with patient's wife to get an idea of how long they are expecting to be out of work. She said they were unable to draw the fluid off today and that he's going to have to have surgery again. They are hoping to meet with the surgeon in the morning. Once they have a surgery date, she will call me back and let me know so we can have a better idea of how long he will be out of work.

## 2016-08-05 NOTE — Telephone Encounter (Signed)
Ok to extend his note as needed to accommodate these procedures

## 2016-08-05 NOTE — Progress Notes (Addendum)
PULMONARY POST HOSPITAL OFFICE FOLLOW UP NOTE   Hospitalization: 04/14-04/180/18 for left lower lobe pneumonia with parapneumonic effusion. Underwent thoracentesis 04/17 with 550 mL removed. Fluid described as amber and was exudative in character with LDH 1356, WBC 14,728, 78% neutrophils. Gram stain and culture were negative.   INTERVAL HISTORY: No major events  SUBJ: He feels that he is recovered completely or nearly so since his recent hospitalization. He has minimal exertional dyspnea. He denies fever and chest pain. He denies cough, sputum, hemoptysis.  OBJ: Vitals:   08/05/16 0947  BP: 132/78  Pulse: (!) 101  SpO2: 96%  Weight: 258 lb (117 kg)  Height: 5\' 11"  (1.803 m)  Room air  Gen: WDWN in NAD HEENT: NCAT, sclerae white, oropharynx normal Neck: NO LAN, no JVD noted Lungs: Dullness to percussion and decreased breath sounds in the left base approximately 1/2 way up Cardiovascular: Reg rate, normal rhythm, no M noted Abdomen: Soft, NT, +BS Ext: no C/C/E Neuro: PERRL, EOMI, motor/sensory grossly intact Skin: No lesions noted   DATA: Chest x-ray today reveals partially loculated, moderately sized left pleural effusion  IMPRESSION: Recurrent exudative parapneumonic effusion without signs or symptoms of active infection at the present time  PLAN: I have scheduled repeat thoracentesis to be performed today. I discussed with Dr. Lowella DandyHenn I will contact him with results of the thoracentesis Follow-up in office 05/25 with chest x-ray prior   Jesus Trujillo Jesus Zatarain, MD PCCM service Mobile [phone] Pager [phone] 08/05/2016 1:47 PM   ADD: Sherron MondaySpoke with Dr Lowella DandyHenn. The "effusion" appears to be fully congealed now - nothing amenable to thoracentesis. I will refer to thoracic surgery  Jesus Trujillo Deshana Rominger, MD PCCM service Mobile [phone] Pager [phone] 08/05/2016 1:54 PM

## 2016-08-05 NOTE — Addendum Note (Signed)
Addended by: Meyer CoryAHMAD, MISTY R on: 08/05/2016 02:44 PM   Modules accepted: Orders

## 2016-08-05 NOTE — Patient Instructions (Signed)
Thoracentesis (repeat) today Follow up 05/25 with chest Xray prior

## 2016-08-06 ENCOUNTER — Ambulatory Visit
Admission: RE | Admit: 2016-08-06 | Discharge: 2016-08-06 | Disposition: A | Discharge status: Home / Self Care | Payer: BC Managed Care – PPO | Source: Ambulatory Visit | Attending: Cardiothoracic Surgery | Admitting: Cardiothoracic Surgery

## 2016-08-06 ENCOUNTER — Encounter: Payer: Self-pay | Admitting: Cardiothoracic Surgery

## 2016-08-06 ENCOUNTER — Ambulatory Visit (INDEPENDENT_AMBULATORY_CARE_PROVIDER_SITE_OTHER): Payer: BC Managed Care – PPO | Admitting: Cardiothoracic Surgery

## 2016-08-06 ENCOUNTER — Telehealth: Payer: Self-pay

## 2016-08-06 VITALS — BP 129/84 | HR 89 | Temp 98.4°F | Ht 71.0 in | Wt 258.6 lb

## 2016-08-06 DIAGNOSIS — J869 Pyothorax without fistula: Secondary | ICD-10-CM

## 2016-08-06 MED ORDER — IOPAMIDOL (ISOVUE-370) INJECTION 76%
75.0000 mL | Freq: Once | INTRAVENOUS | Status: AC | PRN
  Administered 2016-08-06: 75 mL via INTRAVENOUS

## 2016-08-06 NOTE — Progress Notes (Signed)
Patient ID: Jesus NiemannDavid A Trujillo, male   DOB: 01-16-1981, 36 y.o.   MRN: [id number]  Chief Complaint  Patient presents with  . New Patient (Initial Visit)    Multiple Loculated Pleural Effusion/Empyema    Referred By Dr. Wandra Scotavid Simons Reason for Referral left empyema  HPI Location, Quality, Duration, Severity, Timing, Context, Modifying Factors, Associated Signs and Symptoms.  Jesus NiemannDavid A Banker is a 36 y.o. male.  He was admitted to the hospital several weeks ago with severe left-sided pleuritis and an associated pneumonia with small pleural effusion. He was placed on intravenous antibiotics and over the course of the next several days improved with his pain and also underwent an ultrasound-guided thoracentesis. The cultures were negative on the pleural effusion but it did demonstrate approximately 15,000 white blood cells most of which were neutrophils. The patient was discharged to home and followed up with his primary care provider the following day. They continued his outpatient oral antibiotics and several weeks later he followed up with Dr. Darrol AngelSimons in pulmonary medicine. A repeat chest x-ray showed a left pleural effusion and an ultrasound-guided thoracentesis was aborted because of the multiloculated nature of the fluid. The patient was then sent here for consideration of surgical options. He actually states that he feels significantly better. His pain is now minimal. He does get short of breath with exertion. He has not had any fevers. He's had no sputum production. He has an occasional cough when he is outdoors which she relates to allergies. He is not taking any current medications. He's not taking any antibiotics. He has had no fevers or chills.   Past Medical History:  Diagnosis Date  . Anxiety   . History of pleurisy 06/2016  . Multifocal pneumonia 06/2016  . SIRS (systemic inflammatory response syndrome) (HCC) 06/2016    Past Surgical History:  Procedure Laterality Date  .  plueralcentesis      Family History  Problem Relation Age of Onset  . Adopted: Yes  . Family history unknown: Yes    Social History Social History  Substance Use Topics  . Smoking status: Never Smoker  . Smokeless tobacco: Never Used  . Alcohol use Yes     Comment: socially    No Known Allergies  Current Outpatient Prescriptions  Medication Sig Dispense Refill  . Ipratropium-Albuterol (COMBIVENT RESPIMAT) 20-100 MCG/ACT AERS respimat Inhale 1 puff into the lungs every 6 (six) hours as needed for wheezing. 1 Inhaler 0   No current facility-administered medications for this visit.       Review of Systems A complete review of systems was asked and was negative except for the following positive findingsCough, shortness of breath, wheezing.  Blood pressure 129/84, pulse 89, temperature 98.4 F (36.9 C), temperature source Oral, height 5\' 11"  (1.803 m), weight 258 lb 9.6 oz (117.3 kg).  Physical Exam CONSTITUTIONAL:  Pleasant, well-developed, well-nourished, and in no acute distress. EYES: Pupils equal and reactive to light, Sclera non-icteric EARS, NOSE, MOUTH AND THROAT:  The oropharynx was clear.  Dentition is good repair.  Oral mucosa pink and moist. LYMPH NODES:  Lymph nodes in the neck and axillae were normal RESPIRATORY:  Lungs were clear.  Normal respiratory effort without pathologic use of accessory muscles of respiration.  There may have been some decrease breath sounds at the left base but his large size precluded accurate auscultation. CARDIOVASCULAR: Heart was regular without murmurs.  There were no carotid bruits. GI: The abdomen was soft, nontender, and nondistended. There were no palpable  masses. There was no hepatosplenomegaly. There were normal bowel sounds in all quadrants. GU:  Rectal deferred.   MUSCULOSKELETAL:  Normal muscle strength and tone.  No clubbing or cyanosis.   SKIN:  There were no pathologic skin lesions.  There were no nodules on  palpation. NEUROLOGIC:  Sensation is normal.  Cranial nerves are grossly intact. PSYCH:  Oriented to person, place and time.  Mood and affect are normal.  Data Reviewed I have reviewed his medical records including his thoracentesis results, chest x-ray and CT scan.  I have personally reviewed the patient's imaging, laboratory findings and medical records.    Assessment    Left empyema with complex pleural fluid    Plan    I had a long discussion with him and his wife today. I told him that surgical intervention with decortication is the most definitive approach. I understand that this may involve a thoracoscopy or a thoracotomy. I reviewed in detail the indications and risks including risks of bleeding, infection, air leak and death. The family expressed some concern regarding his need for getting back to work. Alternatives were also reviewed including continued outpatient oral antibiotics with diligent follow-up. We also discussed the possibility of admission to the hospital following CT-guided pleural drainage with intrapleural thrombolytics. They understand that this would require him to be in the hospital for several days at a minimum. At the completion of our interview they were unable to make a decision as to how they would like to proceed. The patient states that he's feeling better and he does need to provide for his family and is reluctant to undergo any invasive procedure that may preclude him getting back to work sooner. I explained that the best time to intervene with either intrapleural thrombolytics or decortication is early on in the course of the disease and that waiting only may delay the success of these additional procedures. They understood. They were very grateful for the time we spent with them. I gave them my business card and told him to contact me as soon as possible once they were able to determine how they would like to proceed. I believe that they had all their questions  answered.       Hulda Marin, MD 08/06/2016, 9:14 AM

## 2016-08-06 NOTE — Telephone Encounter (Signed)
Spoke with patient's wife at this time. All questions answered. She states that patient has decided to proceed with Thoracoscopy, Possible Thoracotomy with Decortication.  OR Booking sheet faxed to [facility] at this time. CT Scan Chest ordered and scheduled for today. [facility] been [facility]. Call made to Pre-admit and spoke with Morrie SheldonAshley. Patient's Pre-admission Testing time is for tomorrow 08/07/16- 1300.   Call returned to patient's wife at this time. All information above was explained. Surgical orders have been placed by Dr. Thelma Bargeaks at this time.

## 2016-08-06 NOTE — Patient Instructions (Signed)
Please call our office and speak with Charlayne Vultaggio or Kandis Cocking (Nurses) when you make a decision. If you decide to do surgery and you need to have your FMLA or Disability forms filled out, have them faxed to [phone] and we will fill that paperwork out on the day of surgery.    Thoracotomy Thoracotomy is surgery to open the chest to get access to the organs and tissue inside. This type of surgery is often used to repair or treat the lungs, heart, or arteries, or to remove tissue. Tell a health care provider about:  Any allergies you have.  All medicines you are taking, including vitamins, herbs, eye drops, creams, and over-the-counter medicines.  Any problems you or family members have had with anesthetic medicines.  Any blood disorders you have.  Any surgeries you have had.  Any medical conditions you have.  Whether you are pregnant or may be pregnant. What are the risks? Generally, this is a safe procedure. However, problems may occur, including:  Infection.  Severe bleeding (hemorrhage).  Allergic reaction to medicines.  Damage to other structures or organs, such as nerves.  Abnormal heart rhythm (arrhythmia).  Respiratory failure. This is when not enough oxygen passes from the lungs into the blood. In the case of this procedure, this is most often caused by lung collapse. What happens before the procedure? Medicines   Ask your health care provider about:  Changing or stopping your regular medicines. This is especially important if you are taking diabetes medicines or blood thinners.  Taking medicines such as aspirin and ibuprofen. These medicines can thin your blood. Do not take these medicines before your procedure if your health care provider instructs you not to.  You may be given antibiotic medicine to help prevent infection. Staying hydrated  Follow instructions from your health care provider about hydration, which may include:  Up to 2 hours before the procedure  - you may continue to drink clear liquids, such as water, clear fruit juice, black coffee, and plain tea. Eating and drinking restrictions  Follow instructions from your health care provider about eating and drinking, which may include:  8 hours before the procedure - stop eating heavy meals or foods such as meat, fried foods, or fatty foods.  6 hours before the procedure - stop eating light meals or foods, such as toast or cereal.  6 hours before the procedure - stop drinking milk or drinks that contain milk.  2 hours before the procedure - stop drinking clear liquids. General instructions   You may have tests, such as:  X-rays.  MRI.  CT scan.  Tests of mucus from your lungs (sputum culture).  Blood tests.  Lung (pulmonary) function tests.  A test of heart function and rhythm (electrocardiogram, ECG).  A test to evaluate the blood vessels in your lungs (pulmonary angiogram).  You may be asked to shower with a germ-killing soap.  Ask your health care provider how your surgical site will be marked or identified.  Plan to have someone take you home from the hospital.  Do not use any products that contain nicotine or tobacco, such as cigarettes and e-cigarettes. If you need help quitting, ask your health care provider. What happens during the procedure?  To lower your risk of infection:  Your health care team will wash or sanitize their hands.  Your skin will be washed with soap.  An IV tube will be inserted into one of your veins.  You will be given a medicine  to make you fall asleep (general anesthetic). You may also be given a medicine to help you relax (sedative).  A thin tube (catheter) will be inserted into your urethra and bladder to drain your urine.  A tube will be placed down your throat to help you breathe.  A 5-10 inch (13-25 cm) incision will be made in your chest. The size and exact location of the incision varies depending on the purpose of the  procedure.  A tool (retractor) will be used to separate muscle, tissue, and in some cases, your rib cage. This is done to create easier access to organs and tissue.  Any damaged or diseased tissue inside of your chest will be removed.  A chest tube will be inserted between your ribs to drain fluid. This helps to prevent fluid buildup in your lungs.  Your incision will be closed with stitches (sutures) or staples.  A bandage (dressing) may be placed over the incision. The procedure may vary among health care providers and hospitals. What happens after the procedure?  Your blood pressure, heart rate, breathing rate, and blood oxygen level will be monitored.  You will be given pain medicine as needed.  You will continue to have a chest tube draining fluid from your lungs for 24-48 hours. You will be monitored closely for signs of fluid buildup in your lungs.  You may continue:  To have a breathing tube.  To receive fluids and medicines through an IV tube.  To have a catheter draining your urine.  You may have to wear compression stockings. These stockings help to prevent blood clots and reduce swelling in your legs.  You may be shown how to do breathing exercises and how to use a tool that measures how well you are filling your lungs with each breath (incentive spirometer). These can help prevent pneumonia. Summary  Thoracotomy is surgery to open the chest to get access to the organs and tissue inside.  This type of surgery is often used to repair or treat the lungs, heart, or arteries, or to remove tissue.  During the procedure, a 5-10 inch (13-25 cm) incision will be made in your chest. The size and exact location of the incision varies depending on the purpose of the procedure.  After this procedure, you will continue to have a chest tube draining fluid from your lungs for 24-48 hours. You will be monitored closely for signs of fluid buildup in your lungs. This information is  not intended to replace advice given to you by your health care provider. Make sure you discuss any questions you have with your health care provider. Document Released: 12/13/2002 Document Revised: 12/09/2015 Document Reviewed: 12/09/2015 Elsevier Interactive Patient Education  2017 [employer]

## 2016-08-06 NOTE — Telephone Encounter (Signed)
Patient came in and saw Dr. Thelma Bargeaks today. His wife would like to know if it's okay to have the CT Scan done first and then receive Dr. Thelma Bargeaks professional opinion on what route they should go. Please call patient and advice.

## 2016-08-06 NOTE — Telephone Encounter (Signed)
Spoke with Wonda CeriseKay J. @ [employer]. CPT codes [phone],[id number] and ICD10-J86.9.  Clinical notes faxed at this time to [phone] for review.  Will check status 08/07/16.

## 2016-08-07 ENCOUNTER — Encounter
Admission: RE | Admit: 2016-08-07 | Discharge: 2016-08-07 | Disposition: A | Discharge status: Home / Self Care | Payer: BC Managed Care – PPO | Source: Ambulatory Visit | Attending: Cardiothoracic Surgery | Admitting: Cardiothoracic Surgery

## 2016-08-07 DIAGNOSIS — Z01810 Encounter for preprocedural cardiovascular examination: Secondary | ICD-10-CM

## 2016-08-07 DIAGNOSIS — Z01812 Encounter for preprocedural laboratory examination: Secondary | ICD-10-CM

## 2016-08-07 DIAGNOSIS — Z01818 Encounter for other preprocedural examination: Secondary | ICD-10-CM

## 2016-08-07 DIAGNOSIS — J869 Pyothorax without fistula: Secondary | ICD-10-CM | POA: Insufficient documentation

## 2016-08-07 LAB — CBC
HCT: 39.3 % — ABNORMAL LOW (ref 40.0–52.0)
Hemoglobin: 13.1 g/dL (ref 13.0–18.0)
MCH: 28.3 pg (ref 26.0–34.0)
MCHC: 33.2 g/dL (ref 32.0–36.0)
MCV: 85.2 fL (ref 80.0–100.0)
PLATELETS: 362 10*3/uL (ref 150–440)
RBC: 4.61 MIL/uL (ref 4.40–5.90)
RDW: 13.3 % (ref 11.5–14.5)
WBC: 9.4 10*3/uL (ref 3.8–10.6)

## 2016-08-07 LAB — COMPREHENSIVE METABOLIC PANEL
ALBUMIN: 3.3 g/dL — AB (ref 3.5–5.0)
ALT: 38 U/L (ref 17–63)
AST: 25 U/L (ref 15–41)
Alkaline Phosphatase: 83 U/L (ref 38–126)
Anion gap: 8 (ref 5–15)
BUN: 15 mg/dL (ref 6–20)
CHLORIDE: 102 mmol/L (ref 101–111)
CO2: 27 mmol/L (ref 22–32)
CREATININE: 0.85 mg/dL (ref 0.61–1.24)
Calcium: 9 mg/dL (ref 8.9–10.3)
GFR calc Af Amer: >60 mL/min (ref >60)
GFR calc non Af Amer: >60 mL/min (ref >60)
GLUCOSE: 101 mg/dL — AB (ref 65–99)
POTASSIUM: 4 mmol/L (ref 3.5–5.1)
SODIUM: 137 mmol/L (ref 135–145)
TOTAL PROTEIN: 9.1 g/dL — AB (ref 6.5–8.1)
Total Bilirubin: 0.4 mg/dL (ref 0.3–1.2)

## 2016-08-07 LAB — PROTIME-INR
INR: 1.06
PROTHROMBIN TIME: 13.8 s (ref 11.4–15.2)

## 2016-08-07 LAB — SURGICAL PCR SCREEN
MRSA, PCR: NEGATIVE
STAPHYLOCOCCUS AUREUS: NEGATIVE

## 2016-08-07 LAB — APTT: APTT: 33 s (ref 24–36)

## 2016-08-07 NOTE — Telephone Encounter (Signed)
Lanora Manislizabeth is calling to see if she can get a work note for her husband Jesus Trujillo. He is having surgery on Monday. She would like to be contacted at [phone]. Please call patient and advice.

## 2016-08-07 NOTE — Telephone Encounter (Signed)
Spoke with patient's wife at this time. She would like a note for [facility] to be faxed Attn: Lelon PerlaSharon Walker to [phone] and would like another note from [facility] that she may print. Both letters written at this time. The note to [employer] was faxed per instructions given above with positive confirmation. Both will be available in my chart for printing.  Please see letters in chart.

## 2016-08-07 NOTE — Patient Instructions (Signed)
  Your procedure is scheduled on: Aug 10, 2016 (Monday) Report to Same Day Surgery 2nd floor medical mall [facility](Medical Mall Entrance-take elevator on left to 2nd floor.  Check in with surgery information desk.) ARRIVAL TIME 7:30 am  Remember: Instructions that are not followed completely may result in serious medical risk, up to and including death, or upon the discretion of your surgeon and anesthesiologist your surgery may need to be rescheduled.    _x___ 1. Do not eat food or drink liquids after midnight. No gum chewing or hard candies    __x__ 2. No Alcohol for 24 hours before or after surgery.   __x__3. No Smoking for 24 prior to surgery.   ____  4. Bring all medications with you on the day of surgery if instructed.    __x__ 5. Notify your doctor if there is any change in your medical condition     (cold, fever, infections).     Do not wear jewelry, make-up, hairpins, clips or nail polish.  Do not wear lotions, powders, or perfumes.   Do not shave 48 hours prior to surgery. Men may shave face and neck.  Do not bring valuables to the hospital.    [facility] is not responsible for any belongings or valuables.               Contacts, dentures or bridgework may not be worn into surgery.  Leave your suitcase in the car. After surgery it may be brought to your room.  For patients admitted to the hospital, discharge time is determined by your  treatment team                      Patients discharged the day of surgery will not be allowed to drive home.  You will need someone to drive you home and stay with you the night of your procedure.    Please read over the following fact sheets that you were given:   [facility] Preparing for Surgery and or MRSA Information   ___ Take anti-hypertensive (unless it includes a diuretic), cardiac, seizure, asthma,     anti-reflux and psychiatric medicines. These include:  1.   2.  3.  4.  5.  6.  ____Fleets enema or Magnesium Citrate as  directed.   _x___ Use CHG Soap or sage wipes as directed on instruction sheet   _x___ Use inhalers on the day of surgery and bring to hospital day of surgery (USE COMBIVENT INHALER THE MORNING OF SURGERY AND BRING TO HOSPITAL)  ____ Stop Metformin and Janumet 2 days prior to surgery.    ____ Take 1/2 of usual insulin dose the night before surgery and none on the morning surgery     _x___ Follow recommendations from Cardiologist, Pulmonologist or PCP regarding          stopping Aspirin, Coumadin, Pllavix ,Eliquis, Effient, or Pradaxa, and Pletal.  X____Stop Anti-inflammatories such as Advil, Aleve, Ibuprofen, Motrin, Naproxen, Naprosyn, Goodies powders or aspirin products. OK to take Tylenol  (STOP IBUPROFEN NOW)    _x___ Stop supplements until after surgery.  But may continue Vitamin D, Vitamin B, and multivitamin (STOP MELATONIN NOW)  ____ Bring C-Pap to the hospital.

## 2016-08-10 ENCOUNTER — Inpatient Hospital Stay: Payer: BC Managed Care – PPO | Admitting: Anesthesiology

## 2016-08-10 ENCOUNTER — Inpatient Hospital Stay: Payer: BC Managed Care – PPO

## 2016-08-10 ENCOUNTER — Inpatient Hospital Stay
Admission: RE | Admit: 2016-08-10 | Discharge: 2016-08-17 | DRG: 164 | Disposition: A | Discharge status: Home / Self Care | Payer: BC Managed Care – PPO | Source: Ambulatory Visit | Attending: Cardiothoracic Surgery | Admitting: Cardiothoracic Surgery

## 2016-08-10 ENCOUNTER — Encounter: Payer: Self-pay | Admitting: Anesthesiology

## 2016-08-10 ENCOUNTER — Encounter
Admission: RE | Disposition: A | Discharge status: Home / Self Care | Payer: Self-pay | Source: Ambulatory Visit | Attending: Cardiothoracic Surgery

## 2016-08-10 DIAGNOSIS — F419 Anxiety disorder, unspecified: Secondary | ICD-10-CM | POA: Diagnosis present

## 2016-08-10 DIAGNOSIS — J90 Pleural effusion, not elsewhere classified: Secondary | ICD-10-CM | POA: Diagnosis present

## 2016-08-10 DIAGNOSIS — J869 Pyothorax without fistula: Principal | ICD-10-CM | POA: Diagnosis present

## 2016-08-10 DIAGNOSIS — Z09 Encounter for follow-up examination after completed treatment for conditions other than malignant neoplasm: Secondary | ICD-10-CM

## 2016-08-10 HISTORY — PX: VIDEO BRONCHOSCOPY: SHX5072

## 2016-08-10 HISTORY — PX: VIDEO ASSISTED THORACOSCOPY: SHX5073

## 2016-08-10 HISTORY — PX: THORACOTOMY: SHX5074

## 2016-08-10 LAB — ABO/RH: ABO/RH(D): A POS

## 2016-08-10 LAB — MRSA PCR SCREENING: MRSA BY PCR: NEGATIVE

## 2016-08-10 SURGERY — VIDEO BRONCHOSCOPY WITHOUT FLUORO
Anesthesia: General | Wound class: Clean Contaminated

## 2016-08-10 MED ORDER — KETOROLAC TROMETHAMINE 30 MG/ML IJ SOLN
15.0000 mg | Freq: Three times a day (TID) | INTRAMUSCULAR | Status: DC | PRN
  Administered 2016-08-10 (×2): 15 mg via INTRAVENOUS
  Filled 2016-08-10: qty 1

## 2016-08-10 MED ORDER — ONDANSETRON HCL 4 MG/2ML IJ SOLN
INTRAMUSCULAR | Status: DC | PRN
  Administered 2016-08-10: 4 mg via INTRAVENOUS

## 2016-08-10 MED ORDER — MORPHINE SULFATE (PF) 2 MG/ML IV SOLN
2.0000 mg | Freq: Once | INTRAVENOUS | Status: AC
  Administered 2016-08-10: 2 mg via INTRAVENOUS
  Filled 2016-08-10: qty 1

## 2016-08-10 MED ORDER — ONDANSETRON HCL 4 MG/2ML IJ SOLN: 4.0000 mg | Freq: Once | INTRAMUSCULAR | Status: DC | PRN

## 2016-08-10 MED ORDER — MORPHINE SULFATE (PF) 4 MG/ML IV SOLN
2.0000 mg | INTRAVENOUS | Status: DC | PRN
  Administered 2016-08-10 (×5): 2 mg via INTRAVENOUS
  Filled 2016-08-10 (×5): qty 1

## 2016-08-10 MED ORDER — SUCCINYLCHOLINE CHLORIDE 20 MG/ML IJ SOLN
INTRAMUSCULAR | Status: AC
  Filled 2016-08-10: qty 1

## 2016-08-10 MED ORDER — LACTATED RINGERS IV SOLN
INTRAVENOUS | Status: DC
  Administered 2016-08-10: 08:00:00 via INTRAVENOUS

## 2016-08-10 MED ORDER — FENTANYL CITRATE (PF) 100 MCG/2ML IJ SOLN
INTRAMUSCULAR | Status: DC | PRN
  Administered 2016-08-10 (×7): 50 ug via INTRAVENOUS
  Administered 2016-08-10: 150 ug via INTRAVENOUS

## 2016-08-10 MED ORDER — HYDROMORPHONE HCL 1 MG/ML IJ SOLN
INTRAMUSCULAR | Status: DC | PRN
  Administered 2016-08-10 (×2): 0.5 mg via INTRAVENOUS

## 2016-08-10 MED ORDER — OXYCODONE HCL 5 MG PO TABS
5.0000 mg | ORAL_TABLET | ORAL | Status: DC | PRN
  Administered 2016-08-10 (×2): 5 mg via ORAL
  Administered 2016-08-10 – 2016-08-11 (×2): 10 mg via ORAL
  Filled 2016-08-10 (×2): qty 2
  Filled 2016-08-10 (×2): qty 1

## 2016-08-10 MED ORDER — SODIUM CHLORIDE 0.9 % IV SOLN
3.0000 g | Freq: Four times a day (QID) | INTRAVENOUS | Status: DC
  Administered 2016-08-10 – 2016-08-17 (×28): 3 g via INTRAVENOUS
  Filled 2016-08-10 (×33): qty 3

## 2016-08-10 MED ORDER — EPINEPHRINE PF 1 MG/10ML IJ SOSY
PREFILLED_SYRINGE | INTRAMUSCULAR | Status: AC
  Filled 2016-08-10: qty 20

## 2016-08-10 MED ORDER — ROCURONIUM BROMIDE 100 MG/10ML IV SOLN
INTRAVENOUS | Status: DC | PRN
  Administered 2016-08-10 (×2): 10 mg via INTRAVENOUS
  Administered 2016-08-10: 20 mg via INTRAVENOUS
  Administered 2016-08-10: 40 mg via INTRAVENOUS
  Administered 2016-08-10: 20 mg via INTRAVENOUS
  Administered 2016-08-10: 10 mg via INTRAVENOUS

## 2016-08-10 MED ORDER — HYDROMORPHONE HCL 1 MG/ML IJ SOLN
0.2500 mg | INTRAMUSCULAR | Status: DC | PRN
  Administered 2016-08-10 (×4): 0.5 mg via INTRAVENOUS

## 2016-08-10 MED ORDER — ONDANSETRON HCL 4 MG/2ML IJ SOLN: 4.0000 mg | Freq: Four times a day (QID) | INTRAMUSCULAR | Status: DC | PRN

## 2016-08-10 MED ORDER — FENTANYL CITRATE (PF) 250 MCG/5ML IJ SOLN
INTRAMUSCULAR | Status: AC
  Filled 2016-08-10: qty 5

## 2016-08-10 MED ORDER — SODIUM CHLORIDE 0.9 % IJ SOLN
INTRAMUSCULAR | Status: AC
Start: 2016-08-10 — End: 2016-08-10
  Filled 2016-08-10: qty 50

## 2016-08-10 MED ORDER — KETOROLAC TROMETHAMINE 30 MG/ML IJ SOLN
INTRAMUSCULAR | Status: AC
  Administered 2016-08-10: 15 mg via INTRAVENOUS
  Filled 2016-08-10: qty 1

## 2016-08-10 MED ORDER — FENTANYL CITRATE (PF) 100 MCG/2ML IJ SOLN
INTRAMUSCULAR | Status: AC
  Administered 2016-08-10: 25 ug via INTRAVENOUS
  Filled 2016-08-10: qty 2

## 2016-08-10 MED ORDER — MIDAZOLAM HCL 2 MG/2ML IJ SOLN
INTRAMUSCULAR | Status: DC | PRN
  Administered 2016-08-10: 2 mg via INTRAVENOUS

## 2016-08-10 MED ORDER — ACETAMINOPHEN 10 MG/ML IV SOLN
INTRAVENOUS | Status: DC | PRN
  Administered 2016-08-10: 1000 mg via INTRAVENOUS

## 2016-08-10 MED ORDER — MIDAZOLAM HCL 2 MG/2ML IJ SOLN
1.0000 mg | Freq: Once | INTRAMUSCULAR | Status: AC
  Administered 2016-08-10: 1 mg via INTRAVENOUS

## 2016-08-10 MED ORDER — HYDROMORPHONE HCL 1 MG/ML IJ SOLN
INTRAMUSCULAR | Status: AC
  Filled 2016-08-10: qty 1

## 2016-08-10 MED ORDER — DEXTROSE-NACL 5-0.45 % IV SOLN
INTRAVENOUS | Status: DC
  Administered 2016-08-10 – 2016-08-13 (×3): via INTRAVENOUS

## 2016-08-10 MED ORDER — FAMOTIDINE 20 MG PO TABS
ORAL_TABLET | ORAL | Status: AC
  Administered 2016-08-10: 20 mg
  Filled 2016-08-10: qty 1

## 2016-08-10 MED ORDER — SUCCINYLCHOLINE CHLORIDE 20 MG/ML IJ SOLN
INTRAMUSCULAR | Status: DC | PRN
  Administered 2016-08-10: 100 mg via INTRAVENOUS

## 2016-08-10 MED ORDER — MIDAZOLAM HCL 2 MG/2ML IJ SOLN
INTRAMUSCULAR | Status: AC
  Administered 2016-08-10: 2 mg
  Filled 2016-08-10: qty 2

## 2016-08-10 MED ORDER — HYDROMORPHONE HCL 1 MG/ML IJ SOLN: 0.2500 mg | INTRAMUSCULAR | Status: DC | PRN

## 2016-08-10 MED ORDER — BUPIVACAINE HCL (PF) 0.25 % IJ SOLN
INTRAMUSCULAR | Status: DC | PRN
Start: 2016-08-10 — End: 2016-08-10
  Administered 2016-08-10: 30 mL

## 2016-08-10 MED ORDER — ROCURONIUM BROMIDE 100 MG/10ML IV SOLN
INTRAVENOUS | Status: AC
  Filled 2016-08-10: qty 1

## 2016-08-10 MED ORDER — ACETAMINOPHEN 10 MG/ML IV SOLN
INTRAVENOUS | Status: AC
  Filled 2016-08-10: qty 100

## 2016-08-10 MED ORDER — HYDROMORPHONE HCL 1 MG/ML IJ SOLN
INTRAMUSCULAR | Status: AC
  Administered 2016-08-10: 0.5 mg via INTRAVENOUS
  Filled 2016-08-10: qty 1

## 2016-08-10 MED ORDER — ALBUTEROL SULFATE (2.5 MG/3ML) 0.083% IN NEBU
2.5000 mg | INHALATION_SOLUTION | RESPIRATORY_TRACT | Status: DC
  Administered 2016-08-10 – 2016-08-13 (×12): 2.5 mg via RESPIRATORY_TRACT
  Filled 2016-08-10 (×12): qty 3

## 2016-08-10 MED ORDER — TALC 5 G PL SUSR
INTRAPLEURAL | Status: AC
  Filled 2016-08-10: qty 5

## 2016-08-10 MED ORDER — BUPIVACAINE LIPOSOME 1.3 % IJ SUSP
INTRAMUSCULAR | Status: DC | PRN
  Administered 2016-08-10: 100 mL

## 2016-08-10 MED ORDER — FENTANYL CITRATE (PF) 100 MCG/2ML IJ SOLN
25.0000 ug | INTRAMUSCULAR | Status: DC | PRN
  Administered 2016-08-10 (×4): 25 ug via INTRAVENOUS

## 2016-08-10 MED ORDER — PROPOFOL 10 MG/ML IV BOLUS
INTRAVENOUS | Status: DC | PRN
  Administered 2016-08-10: 200 mg via INTRAVENOUS

## 2016-08-10 MED ORDER — BUPIVACAINE LIPOSOME 1.3 % IJ SUSP
INTRAMUSCULAR | Status: AC
  Filled 2016-08-10: qty 20

## 2016-08-10 MED ORDER — ATROPINE SULFATE 1 MG/10ML IJ SOSY
PREFILLED_SYRINGE | INTRAMUSCULAR | Status: AC
  Filled 2016-08-10: qty 20

## 2016-08-10 MED ORDER — DEXTROSE 5 % IV SOLN
1.5000 g | Freq: Two times a day (BID) | INTRAVENOUS | Status: DC
  Administered 2016-08-10: 1.5 g via INTRAVENOUS
  Filled 2016-08-10 (×2): qty 1.5

## 2016-08-10 MED ORDER — SODIUM CHLORIDE FLUSH 0.9 % IV SOLN
INTRAVENOUS | Status: AC
  Administered 2016-08-10: 14:00:00
  Filled 2016-08-10: qty 6

## 2016-08-10 MED ORDER — TRAMADOL HCL 50 MG PO TABS
50.0000 mg | ORAL_TABLET | Freq: Four times a day (QID) | ORAL | Status: DC
  Administered 2016-08-10 – 2016-08-11 (×4): 50 mg via ORAL
  Filled 2016-08-10 (×3): qty 1

## 2016-08-10 MED ORDER — SUGAMMADEX SODIUM 500 MG/5ML IV SOLN
INTRAVENOUS | Status: DC | PRN
  Administered 2016-08-10: 250 mg via INTRAVENOUS

## 2016-08-10 MED ORDER — MORPHINE SULFATE (PF) 2 MG/ML IV SOLN: 2.0000 mg | INTRAVENOUS | Status: DC | PRN

## 2016-08-10 MED ORDER — MIDAZOLAM HCL 2 MG/2ML IJ SOLN
INTRAMUSCULAR | Status: AC
  Filled 2016-08-10: qty 2

## 2016-08-10 MED ORDER — FAMOTIDINE 20 MG PO TABS
20.0000 mg | ORAL_TABLET | Freq: Once | ORAL | Status: AC
  Administered 2016-08-10: 20 mg via ORAL

## 2016-08-10 MED ORDER — MORPHINE SULFATE (PF) 4 MG/ML IV SOLN: 4.0000 mg | INTRAVENOUS | Status: DC | PRN

## 2016-08-10 MED ORDER — PROPOFOL 10 MG/ML IV BOLUS
INTRAVENOUS | Status: AC
  Filled 2016-08-10: qty 20

## 2016-08-10 MED ORDER — BISACODYL 5 MG PO TBEC
10.0000 mg | DELAYED_RELEASE_TABLET | Freq: Every day | ORAL | Status: DC
  Administered 2016-08-10 – 2016-08-13 (×4): 10 mg via ORAL
  Filled 2016-08-10 (×5): qty 2

## 2016-08-10 MED ORDER — DEXTROSE 5 % IV SOLN
1.5000 g | INTRAVENOUS | Status: AC
  Administered 2016-08-10: 1.5 g via INTRAVENOUS
  Filled 2016-08-10: qty 1.5

## 2016-08-10 MED ORDER — BUPIVACAINE HCL (PF) 0.25 % IJ SOLN
INTRAMUSCULAR | Status: AC
  Filled 2016-08-10: qty 30

## 2016-08-10 SURGICAL SUPPLY — 82 items
BENZOIN TINCTURE PRP APPL 2/3 (GAUZE/BANDAGES/DRESSINGS) IMPLANT
BNDG COHESIVE 4X5 TAN STRL (GAUZE/BANDAGES/DRESSINGS) IMPLANT
BRONCHOSCOPE PED SLIM DISP (MISCELLANEOUS) ×3 IMPLANT
CANISTER SUCT 1200ML W/VALVE (MISCELLANEOUS) ×3 IMPLANT
CATH TRAY 16F METER LATEX (MISCELLANEOUS) ×3 IMPLANT
CATH URET ROBINSON 16FR STRL (CATHETERS) ×3 IMPLANT
CHLORAPREP W/TINT 26ML (MISCELLANEOUS) ×6 IMPLANT
CLEANER CAUTERY TIP 5X5 PAD (MISCELLANEOUS) ×2 IMPLANT
CNTNR SPEC 2.5X3XGRAD LEK (MISCELLANEOUS) ×4
CONN REDUCER 1/4X3/8 STR (CONNECTOR) ×3
CONN REDUCER 3/8X3/8X3/8Y (CONNECTOR) ×3
CONNECTOR REDUCER 1/4X3/8 STR (CONNECTOR) ×2 IMPLANT
CONNECTOR REDUCER 3/8X3/8X3/8Y (CONNECTOR) ×2 IMPLANT
CONT SPEC 4OZ STER OR WHT (MISCELLANEOUS) ×2
CONTAINER SPEC 2.5X3XGRAD LEK (MISCELLANEOUS) ×4 IMPLANT
CUTTER ECHEON FLEX ENDO 45 340 (ENDOMECHANICALS) IMPLANT
DEFOGGER SCOPE WARMER CLEARIFY (MISCELLANEOUS) ×3 IMPLANT
DRAIN CHANNEL 28F RND 3/8 FF (WOUND CARE) ×9 IMPLANT
DRAIN CHEST DRY SUCT SGL (MISCELLANEOUS) ×3 IMPLANT
DRAPE C-SECTION (MISCELLANEOUS) ×3 IMPLANT
DRAPE MAG INST 16X20 L/F (DRAPES) ×3 IMPLANT
DRAPE POUCH INSTRU U-SHP 10X18 (DRAPES) ×3 IMPLANT
DRSG OPSITE POSTOP 3X4 (GAUZE/BANDAGES/DRESSINGS) IMPLANT
DRSG OPSITE POSTOP 4X12 (GAUZE/BANDAGES/DRESSINGS) ×3 IMPLANT
DRSG OPSITE POSTOP 4X6 (GAUZE/BANDAGES/DRESSINGS) IMPLANT
DRSG OPSITE POSTOP 4X8 (GAUZE/BANDAGES/DRESSINGS) IMPLANT
DRSG TELFA 3X8 NADH (GAUZE/BANDAGES/DRESSINGS) ×3 IMPLANT
ELECT BLADE 6 FLAT ULTRCLN (ELECTRODE) ×3 IMPLANT
ELECT BLADE 6.5 EXT (BLADE) IMPLANT
ELECT CAUTERY BLADE TIP 2.5 (TIP) ×3
ELECT REM PT RETURN 9FT ADLT (ELECTROSURGICAL) ×3
ELECTRODE CAUTERY BLDE TIP 2.5 (TIP) ×2 IMPLANT
ELECTRODE REM PT RTRN 9FT ADLT (ELECTROSURGICAL) ×2 IMPLANT
GAUZE SPONGE 4X4 12PLY STRL (GAUZE/BANDAGES/DRESSINGS) ×3 IMPLANT
GLOVE SURG SYN 7.5  E (GLOVE) ×5
GLOVE SURG SYN 7.5 E (GLOVE) ×10 IMPLANT
GOWN STRL REUS W/ TWL LRG LVL3 (GOWN DISPOSABLE) ×6 IMPLANT
GOWN STRL REUS W/TWL LRG LVL3 (GOWN DISPOSABLE) ×3
KIT RM TURNOVER STRD PROC AR (KITS) ×3 IMPLANT
LABEL OR SOLS (LABEL) ×3 IMPLANT
LOOP RED MAXI  1X406MM (MISCELLANEOUS) ×1
LOOP VESSEL MAXI 1X406 RED (MISCELLANEOUS) ×2 IMPLANT
MARKER SKIN DUAL TIP RULER LAB (MISCELLANEOUS) ×3 IMPLANT
NEEDLE SPNL 20GX3.5 QUINCKE YW (NEEDLE) ×3 IMPLANT
NS IRRIG 500ML POUR BTL (IV SOLUTION) IMPLANT
PACK BASIN MAJOR ARMC (MISCELLANEOUS) ×3 IMPLANT
PAD CLEANER CAUTERY TIP 5X5 (MISCELLANEOUS) ×1
REDUCER CONN 3/8X1/4X1/4IN (MISCELLANEOUS) ×3 IMPLANT
RELOAD STAPLER LINE PROX 30 GR (STAPLE) IMPLANT
SCISSORS METZENBAUM CVD 33 (INSTRUMENTS) IMPLANT
SPONGE KITTNER 5P (MISCELLANEOUS) ×12 IMPLANT
STAPLER RELOAD LINE PROX 30 GR (STAPLE)
STAPLER SKIN PROX 35W (STAPLE) ×3 IMPLANT
STAPLER VASCULAR ECHELON 35 (CUTTER) IMPLANT
STRIP CLOSURE SKIN 1/2X4 (GAUZE/BANDAGES/DRESSINGS) IMPLANT
SUT CHROMIC 3 0 SH 27 (SUTURE) ×6 IMPLANT
SUT ETHILON 4-0 (SUTURE)
SUT ETHILON 4-0 FS2 18XMFL BLK (SUTURE)
SUT MNCRL AB 3-0 PS2 27 (SUTURE) IMPLANT
SUT PROLENE 0 CT 1 30 (SUTURE) ×21 IMPLANT
SUT PROLENE 5 0 RB 1 DA (SUTURE) IMPLANT
SUT SILK 0 (SUTURE) ×1
SUT SILK 0 30XBRD TIE 6 (SUTURE) ×2 IMPLANT
SUT SILK 1 SH (SUTURE) ×18 IMPLANT
SUT VIC AB 0 CT1 36 (SUTURE) ×6 IMPLANT
SUT VIC AB 2-0 CT1 27 (SUTURE)
SUT VIC AB 2-0 CT1 TAPERPNT 27 (SUTURE) IMPLANT
SUT VIC AB 2-0 CT2 27 (SUTURE) ×6 IMPLANT
SUT VIC AB 3-0 SH 27 (SUTURE) ×1
SUT VIC AB 3-0 SH 27X BRD (SUTURE) ×2 IMPLANT
SUT VICRYL 2 TP 1 (SUTURE) ×9 IMPLANT
SUTURE ETHLN 4-0 FS2 18XMF BLK (SUTURE) IMPLANT
SWAB DUAL CULTURE TRANS RED ST (MISCELLANEOUS) ×3 IMPLANT
SYR 10ML SLIP (SYRINGE) IMPLANT
SYR BULB IRRIG 60ML STRL (SYRINGE) ×3 IMPLANT
TAPE ADH 3 LX (MISCELLANEOUS) ×3 IMPLANT
TAPE TRANSPORE STRL 2 31045 (GAUZE/BANDAGES/DRESSINGS) ×3 IMPLANT
TROCAR FLEXIPATH 20X80 (ENDOMECHANICALS) IMPLANT
TROCAR FLEXIPATH THORACIC 15MM (ENDOMECHANICALS) IMPLANT
TUBING CONNECTING 10 (TUBING) ×3 IMPLANT
WATER STERILE IRR 1000ML POUR (IV SOLUTION) ×3 IMPLANT
YANKAUER SUCT BULB TIP FLEX NO (MISCELLANEOUS) ×3 IMPLANT

## 2016-08-10 NOTE — Consult Note (Signed)
[facility] Infectious Disease     Reason for Consult: Empyema    Referring Physician: Marta Lamas Date of Admission:  08/10/2016   Active Problems:   Empyema of left pleural space [place])   HPI: Jesus Trujillo is a 36 y.o. male admitted originally 4/14-4/18 with pna and parapneumonic effusion. He was dced on levofloxacin at that time. Cultures were negative from blood and pleural fluid.  Readmitted for VATS given persistent multiloculated pleural effusion. He clinically had been improving with no fevers, chills an dmild cough.  Underwent decortication 5/14 with findings of turbid fluid and small loculations with pus. GS neg and culture pending.   Past Medical History:  Diagnosis Date  . Anxiety   . History of pleurisy 06/2016  . Multifocal pneumonia 06/2016  . SIRS (systemic inflammatory response syndrome) ([place]) 06/2016   Past Surgical History:  Procedure Laterality Date  . plueralcentesis     Social History  Substance Use Topics  . Smoking status: Never Smoker  . Smokeless tobacco: Never Used  . Alcohol use Yes     Comment: socially   Family History  Problem Relation Age of Onset  . Adopted: Yes  . Family history unknown: Yes    Allergies: No Known Allergies  Current antibiotics: Antibiotics Given (last 72 hours)    Date/Time Action Medication Dose   08/10/16 0922 New Bag/Given   cefUROXime (ZINACEF) 1.5 g in dextrose 5 % 50 mL IVPB 1.5 g      MEDICATIONS: . albuterol  2.5 mg Nebulization Q4H while awake  . bisacodyl  10 mg Oral Daily  . famotidine      . midazolam      . traMADol  50 mg Oral Q6H    Review of Systems - 11 systems reviewed and negative per HPI   OBJECTIVE: Temp:  [96.9 F (36.1 C)-97.9 F (36.6 C)] 97.9 F (36.6 C) (05/14 1454) Pulse Rate:  [79-114] 79 (05/14 1600) Resp:  [12-30] 26 (05/14 1600) BP: (120-145)/(63-89) 120/79 (05/14 1600) SpO2:  [94 %-99 %] 98 % (05/14 1600) Weight:  [117 kg (258 lb)] 117 kg (258 lb) (05/14  0724) Physical Exam  Constitutional: He is sleepy from pain meds HENT: anicteric Mouth/Throat: Oropharynx is clear and moist. No oropharyngeal exudate.  Cardiovascular: Normal rate, regular rhythm and normal heart sounds. Pulmonary/Chest:dec bs on L, CT in place with ss drainage Abdominal: Soft. Bowel sounds are normal. He exhibits no distension. There is no tenderness.  Lymphadenopathy: He has no cervical adenopathy.  Neurological: He is interactive and able to answer questions Skin: Skin is warm and dry. No rash noted. No erythema.  Psychiatric: He has a normal mood and affect. His behavior is normal.     LABS: Results for orders placed or performed during the hospital encounter of 08/10/16 (from the past 48 hour(s))  Prepare RBC (crossmatch)     Status: None   Collection Time: 08/10/16  7:57 AM  Result Value Ref Range   Order Confirmation ORDER PROCESSED BY BLOOD BANK   ABO/Rh     Status: None   Collection Time: 08/10/16  7:57 AM  Result Value Ref Range   ABO/RH(D) A POS   Aerobic/Anaerobic Culture (surgical/deep wound)     Status: None (Preliminary result)   Collection Time: 08/10/16 10:20 AM  Result Value Ref Range   Specimen Description TISSUE EMPYEMA LEFT LUNG    Special Requests SWAB SENT    Gram Stain      RARE WBC PRESENT,BOTH PMN  AND MONONUCLEAR NO ORGANISMS SEEN Performed at [facility], [place] [address] [place], [place] [address]    Culture PENDING    Report Status PENDING    No components found for: ESR, C REACTIVE PROTEIN MICRO: Recent Results (from the past 720 hour(s))  Blood Culture (routine x 2)     Status: None   Collection Time: 07/11/16  6:01 PM  Result Value Ref Range Status   Specimen Description BLOOD LEFT HAND  Final   Special Requests   Final    BOTTLES DRAWN AEROBIC AND ANAEROBIC Blood Culture adequate volume   Culture NO GROWTH 5 DAYS  Final   Report Status 07/16/2016 FINAL  Final  Blood Culture (routine x 2)     Status: None    Collection Time: 07/11/16  6:01 PM  Result Value Ref Range Status   Specimen Description BLOOD RIGHT ARM  Final   Special Requests   Final    BOTTLES DRAWN AEROBIC AND ANAEROBIC Blood Culture results may not be optimal due to an excessive volume of blood received in culture bottles   Culture NO GROWTH 5 DAYS  Final   Report Status 07/16/2016 FINAL  Final  MRSA PCR Screening     Status: None   Collection Time: 07/13/16  1:35 PM  Result Value Ref Range Status   MRSA by PCR NEGATIVE NEGATIVE Final    Comment:        The GeneXpert MRSA Assay (FDA approved for NASAL specimens only), is one component of a comprehensive MRSA colonization surveillance program. It is not intended to diagnose MRSA infection nor to guide or monitor treatment for MRSA infections.   Body fluid culture     Status: None   Collection Time: 07/14/16 12:15 PM  Result Value Ref Range Status   Specimen Description PLEURAL  Final   Special Requests NONE  Final   Gram Stain   Final    MODERATE WBC PRESENT, PREDOMINANTLY PMN NO ORGANISMS SEEN    Culture   Final    NO GROWTH 3 DAYS Performed at [facility], [place] [address], [place], [place] [address]    Report Status 07/17/2016 FINAL  Final  Surgical pcr screen     Status: None   Collection Time: 08/07/16  1:48 PM  Result Value Ref Range Status   MRSA, PCR NEGATIVE NEGATIVE Final   Staphylococcus aureus NEGATIVE NEGATIVE Final    Comment:        The Xpert SA Assay (FDA approved for NASAL specimens in patients over 74 years of age), is one component of a comprehensive surveillance program.  Test performance has been validated by [facility] for patients greater than or equal to 30 year old. It is not intended to diagnose infection nor to guide or monitor treatment.   Aerobic/Anaerobic Culture (surgical/deep wound)     Status: None (Preliminary result)   Collection Time: 08/10/16 10:20 AM  Result Value Ref Range Status   Specimen Description  TISSUE EMPYEMA LEFT LUNG  Final   Special Requests SWAB SENT  Final   Gram Stain   Final    RARE WBC PRESENT,BOTH PMN AND MONONUCLEAR NO ORGANISMS SEEN Performed at [facility], [place] [address] [place], [place] [address]    Culture PENDING  Incomplete   Report Status PENDING  Incomplete    IMAGING: Dg Chest 1 View  Result Date: 07/14/2016 CLINICAL DATA:  Status post left-sided thoracentesis EXAM: CHEST 1 VIEW COMPARISON:  Portable  chest x-ray of 13 July 2016 FINDINGS: There remains considerable volume loss on the left with only a small amount of aerated upper lobe visible. The pleural effusion volume has decreased slightly. The right lung is well-expanded. There is no pneumothorax. The cardiac silhouette is enlarged but the left heart border is obscured. The pulmonary vascularity is not engorged. IMPRESSION: Slight interval decrease in the volume of the pleural effusion on the left. No postprocedure pneumothorax. Electronically Signed   By: Montrae  Martinique M.D.   On: 07/14/2016 12:46   Dg Chest 2 View  Result Date: 08/05/2016 CLINICAL DATA:  Recurrent pneumonia. EXAM: CHEST  2 VIEW COMPARISON:  Radiograph of July 14, 2016. FINDINGS: The visualized portion of the cardiomediastinal silhouette appears normal. No pneumothorax is noted. Right lung is clear. Moderate-sized loculated left pleural effusion is again noted with associated pneumonia or atelectasis. Bony thorax is unremarkable. IMPRESSION: Grossly stable moderate-sized loculated left pleural effusion with associated pneumonia or atelectasis. Electronically Signed   By: Marijo Conception, M.D.   On: 08/05/2016 09:42   Ct Chest W Contrast  Result Date: 08/06/2016 CLINICAL DATA:  Dry cough for 1 month. EXAM: CT CHEST WITH CONTRAST TECHNIQUE: Multidetector CT imaging of the chest was performed during intravenous contrast administration. CONTRAST:  75 cc Isovue 370 COMPARISON:  07/11/2016 FINDINGS: Cardiovascular: Heart size normal. No  pericardial effusion. No thoracic aortic aneurysm. Mediastinum/Nodes: Mediastinal lymphadenopathy and lower paraesophageal lymphadenopathy is progressed in the interval. 13 mm short axis precarinal lymph node on today's study was the 9 mm short axis lymph node measured previously. 11 mm short axis subcarinal lymph node measured on the previous study is now 13 mm short axis. 11 mm short axis paraesophageal lymph node (image 85) was 6 mm short axis when I remeasure it on the prior study. No gross hilar lymphadenopathy. There is no axillary lymphadenopathy. Lungs/Pleura: Right lung remains clear. Interval progression of left upper and lower lobe consolidative airspace disease in a bandlike configuration through the lateral lung. Some of this disease tracks centrally towards the inferior left hilum. Similar size of the left pleural fluid collection although this shows circumferential rim enhancement in the posterior costophrenic sulcus with areas of lateral loculation evident. Upper Abdomen: Unremarkable. Musculoskeletal: Bone windows reveal no worrisome lytic or sclerotic osseous lesions. IMPRESSION: 1. Interval progression of lateral left lung collapse/consolidation with interval development of circumferential enhancement of the loculated left pleural fluid collection, concerning for evolving empyema. 2. Interval progression of mediastinal lymphadenopathy, presumably reactive. Electronically Signed   By: Misty Stanley M.D.   On: 08/06/2016 15:48   Ct Angio Chest Pe W And/or Wo Contrast  Result Date: 07/11/2016 CLINICAL DATA:  Tachycardia. Cough. Left lower pleuritic chest pain. EXAM: CT ANGIOGRAPHY CHEST WITH CONTRAST TECHNIQUE: Multidetector CT imaging of the chest was performed using the standard protocol during bolus administration of intravenous contrast. Multiplanar CT image reconstructions and MIPs were obtained to evaluate the vascular anatomy. CONTRAST:  75 cc Isovue 370 IV. COMPARISON:  Chest radiograph  from earlier today. FINDINGS: Cardiovascular: The study is low quality for the evaluation of pulmonary embolism due to limited contrast opacification of the pulmonary arteries and motion artifact, precluding evaluation of the subsegmental pulmonary artery branches particularly in the left lung. There are no filling defects in the central, lobar or segmental pulmonary artery branches to suggest acute pulmonary embolism. Great vessels are normal in course and caliber. Normal heart size. No significant pericardial fluid/thickening. Mediastinum/Nodes: No discrete thyroid nodules. Unremarkable esophagus. No axillary adenopathy.  Mildly enlarged 1.1 cm subcarinal node (series 4/ image 35). No additional pathologically enlarged mediastinal or hilar nodes. Lungs/Pleura: No pneumothorax. Small basilar left pleural effusion, possibly loculated with subpulmonic component. No right pleural effusion. There is patchy consolidation and ground-glass attenuation throughout the inferior segment lingula and basilar left lower lobe. No consolidative airspace disease in the right lung. No significant pulmonary nodules in the aerated portions of the lungs. Upper abdomen: Unremarkable. Musculoskeletal: No aggressive appearing focal osseous lesions. Mild thoracic spondylosis. Review of the MIP images confirms the above findings. IMPRESSION: 1. Limited scan with no evidence of pulmonary embolism . 2. Patchy consolidation and ground-glass attenuation throughout the inferior segment lingula and basilar left lower lobe, favor multilobar pneumonia. Recommend follow-up chest radiographs to resolution . 3. Small basilar left pleural effusion, possibly loculated . 4. Mild subcarinal lymphadenopathy, nonspecific, probably reactive. Electronically Signed   By: Ilona Sorrel M.D.   On: 07/11/2016 17:48   [place] Chest  Result Date: 08/05/2016 CLINICAL DATA:  Left parapneumonic effusion. Evaluate for a thoracentesis. EXAM: CHEST ULTRASOUND COMPARISON:   Chest radiograph 08/05/2016 FINDINGS: Left side of the chest was thoroughly evaluated with ultrasound. Complex material was noted throughout the left pleural space. No significant simple fluid was identified. IMPRESSION: Complex loculated left pleural effusion. Thoracentesis not performed. Electronically Signed   By: Markus Daft M.D.   On: 08/05/2016 14:23   Dg Chest Port 1 View  Result Date: 08/10/2016 CLINICAL DATA:  Status post left thoracotomy EXAM: PORTABLE CHEST 1 VIEW COMPARISON:  Chest radiograph Aug 05, 2016 and chest CT Aug 06, 2016 FINDINGS: Chest tubes are present on the left. No pneumothorax. There is resolution of most of the left pleural effusion compared to recent studies. There is airspace consolidation in the left mid lower lung zones. Prep right lung is clear except for slight right base atelectasis. Heart is borderline enlarged with pulmonary vascularity within normal limits. No adenopathy. No bone lesions. IMPRESSION: Chest tubes on the left without pneumothorax. Consolidation throughout much of the left mid and lower lung zones, likely due to compressive atelectasis, likely with superimposed degree of pneumonia. Most of left pleural effusion has resolved. Right lung clear except for slight right base atelectasis. Stable cardiac silhouette. No adenopathy appreciable. Electronically Signed   By: Lowella Grip III M.D.   On: 08/10/2016 14:28   Dg Chest Port 1 View  Result Date: 07/13/2016 CLINICAL DATA:  Short of breath.  Weakness and chest pain EXAM: PORTABLE CHEST 1 VIEW COMPARISON:  07/11/2016 FINDINGS: Cardiac enlargement is again noted. There is a large left pleural effusion. A small amount of aerated lung is identified within the left apex. Right lung appears clear. IMPRESSION: 1. Large left pleural effusion. Electronically Signed   By: Kerby Moors M.D.   On: 07/13/2016 15:08   Ct Renal Stone Study  Result Date: 07/13/2016 CLINICAL DATA:  Left flank pain EXAM: CT ABDOMEN AND  PELVIS WITHOUT CONTRAST TECHNIQUE: Multidetector CT imaging of the abdomen and pelvis was performed following the standard protocol without IV contrast. COMPARISON:  Chest CT 07/11/2016 FINDINGS: Lower chest: There is consolidation of the left lower lobe with a small pleural effusion. Hepatobiliary: Normal noncontrast appearance of the liver. No visible biliary dilatation. Normal gallbladder. Pancreas: Normal noncontrast appearance of the pancreas. No peripancreatic fluid collection. Spleen: Normal. Adrenal glands: Normal. Urinary Tract: --Right kidney/ureter: No hydronephrosis or perinephric stranding. No nephrolithiasis. No obstructing ureteral stones. --Left kidney/ureter: No hydronephrosis or perinephric stranding. No nephrolithiasis. No obstructing ureteral stones. --Urinary  bladder: Unremarkable. Stomach/Bowel: No dilated loops of bowel. No evidence of colonic or enteric inflammation. No fluid collection within the abdomen. Vascular/Lymphatic: No abdominal aortic aneurysm or atherosclerotic calcification. No abdominal or pelvic lymphadenopathy. Reproductive: Normal prostate and seminal vesicles. Musculoskeletal. No focal osseous lesion. Normal visualized extraperitoneal and extrathoracic soft tissues. IMPRESSION: 1. No obstructive uropathy or nephrolithiasis. 2. Left lower lobe consolidation and small pleural effusion, unchanged from recent chest CT and again likely indicating pneumonia. Electronically Signed   By: Ulyses Jarred M.D.   On: 07/13/2016 03:17   US Thoracentesis Asp Pleural Space W/img Guide  Result Date: 07/14/2016 INDICATION: Community acquired pneumonia with the left pleural effusion. Request for a diagnostic thoracentesis. EXAM: ULTRASOUND GUIDED LEFT THORACENTESIS MEDICATIONS: None. COMPLICATIONS: None immediate. PROCEDURE: An ultrasound guided thoracentesis was thoroughly discussed with the patient and questions answered. The benefits, risks, alternatives and complications were also  discussed. The patient understands and wishes to proceed with the procedure. Written consent was obtained. Ultrasound was performed to localize and mark an adequate pocket of fluid in the left chest. The area was then prepped and draped in the normal sterile fashion. 1% Lidocaine was used for local anesthesia. Under ultrasound guidance a 6 Fr Safe-T-Centesis catheter was introduced. Thoracentesis was performed. The catheter was removed and a dressing applied. FINDINGS: A total of approximately 550 mL of amber colored fluid was removed. Samples were sent to the laboratory as requested by the clinical team. IMPRESSION: Successful ultrasound guided left thoracentesis yielding 550 mL of pleural fluid. Electronically Signed   By: Markus Daft M.D.   On: 07/14/2016 13:11    Assessment:   ALEN MATHESON is a 36 y.o. male with Empyema following episode of CAP now s/p decortication 5/14 Cultures are pending from OR. Prior bcx and pleural fluid cx neg but no sputum cx done. Leg and strep PNA urine ags were negative at initial admission. HIV neg. MRS PCR negative last admission.  Recommendations I would rec starting unasyn at this point. Will likely need prolonged abx but if cultures negative for resistant pathogens can likely dc on prolonged oral augmentin.  Plan would be to treat until compelte radiological resolution.  Thank you very much for allowing me to participate in the care of this patient. Please call with questions.   Cheral Marker. Ola Spurr, MD

## 2016-08-10 NOTE — Interval H&P Note (Signed)
History and Physical Interval Note:  08/10/2016 8:51 AM  Jesus Trujillo  has presented today for surgery, with the diagnosis of empyema left lung  The various methods of treatment have been discussed with the patient and family. After consideration of risks, benefits and other options for treatment, the patient has consented to  Procedure(s): PREOP BRONCHOSCOPY (N/A) VIDEO ASSISTED THORACOSCOPY (Left) THORACOTOMY MAJOR WITH DECORTICATION (Left) as a surgical intervention .  The patient's history has been reviewed, patient examined, no change in status, stable for surgery.  I have reviewed the patient's chart and labs.  Questions were answered to the patient's satisfaction.     Hulda Marinimothy Norlan Rann

## 2016-08-10 NOTE — H&P (View-Only) (Signed)
Patient ID: Jesus Trujillo, male   DOB: 01-16-1981, 36 y.o.   MRN: [id number]  Chief Complaint  Patient presents with  . New Patient (Initial Visit)    Multiple Loculated Pleural Effusion/Empyema    Referred By Dr. Wandra Scotavid Trujillo Reason for Referral left empyema  HPI Location, Quality, Duration, Severity, Timing, Context, Modifying Factors, Associated Signs and Symptoms.  Jesus Trujillo is a 36 y.o. male.  He was admitted to the hospital several weeks ago with severe left-sided pleuritis and an associated pneumonia with small pleural effusion. He was placed on intravenous antibiotics and over the course of the next several days improved with his pain and also underwent an ultrasound-guided thoracentesis. The cultures were negative on the pleural effusion but it did demonstrate approximately 15,000 white blood cells most of which were neutrophils. The patient was discharged to home and followed up with his primary care provider the following day. They continued his outpatient oral antibiotics and several weeks later he followed up with Dr. Darrol Trujillo in pulmonary medicine. A repeat chest x-ray showed a left pleural effusion and an ultrasound-guided thoracentesis was aborted because of the multiloculated nature of the fluid. The patient was then sent here for consideration of surgical options. He actually states that he feels significantly better. His pain is now minimal. He does get short of breath with exertion. He has not had any fevers. He's had no sputum production. He has an occasional cough when he is outdoors which she relates to allergies. He is not taking any current medications. He's not taking any antibiotics. He has had no fevers or chills.   Past Medical History:  Diagnosis Date  . Anxiety   . History of pleurisy 06/2016  . Multifocal pneumonia 06/2016  . SIRS (systemic inflammatory response syndrome) ([facility]) 06/2016    Past Surgical History:  Procedure Laterality Date  .  plueralcentesis      Family History  Problem Relation Age of Onset  . Adopted: Yes  . Family history unknown: Yes    Social History Social History  Substance Use Topics  . Smoking status: Never Smoker  . Smokeless tobacco: Never Used  . Alcohol use Yes     Comment: socially    No Known Allergies  Current Outpatient Prescriptions  Medication Sig Dispense Refill  . Ipratropium-Albuterol (COMBIVENT RESPIMAT) 20-100 MCG/ACT AERS respimat Inhale 1 puff into the lungs every 6 (six) hours as needed for wheezing. 1 Inhaler 0   No current facility-administered medications for this visit.       Review of Systems A complete review of systems was asked and was negative except for the following positive findingsCough, shortness of breath, wheezing.  Blood pressure 129/84, pulse 89, temperature 98.4 F (36.9 C), temperature source Oral, height 5\' 11"  (1.803 m), weight 258 lb 9.6 oz (117.3 kg).  Physical Exam CONSTITUTIONAL:  Pleasant, well-developed, well-nourished, and in no acute distress. EYES: Pupils equal and reactive to light, Sclera non-icteric EARS, NOSE, MOUTH AND THROAT:  The oropharynx was clear.  Dentition is good repair.  Oral mucosa pink and moist. LYMPH NODES:  Lymph nodes in the neck and axillae were normal RESPIRATORY:  Lungs were clear.  Normal respiratory effort without pathologic use of accessory muscles of respiration.  There may have been some decrease breath sounds at the left base but his large size precluded accurate auscultation. CARDIOVASCULAR: Heart was regular without murmurs.  There were no carotid bruits. GI: The abdomen was soft, nontender, and nondistended. There were no palpable  masses. There was no hepatosplenomegaly. There were normal bowel sounds in all quadrants. GU:  Rectal deferred.   MUSCULOSKELETAL:  Normal muscle strength and tone.  No clubbing or cyanosis.   SKIN:  There were no pathologic skin lesions.  There were no nodules on  palpation. NEUROLOGIC:  Sensation is normal.  Cranial nerves are grossly intact. PSYCH:  Oriented to person, place and time.  Mood and affect are normal.  Data Reviewed I have reviewed his medical records including his thoracentesis results, chest x-ray and CT scan.  I have personally reviewed the patient's imaging, laboratory findings and medical records.    Assessment    Left empyema with complex pleural fluid    Plan    I had a long discussion with him and his wife today. I told him that surgical intervention with decortication is the most definitive approach. I understand that this may involve a thoracoscopy or a thoracotomy. I reviewed in detail the indications and risks including risks of bleeding, infection, air leak and death. The family expressed some concern regarding his need for getting back to work. Alternatives were also reviewed including continued outpatient oral antibiotics with diligent follow-up. We also discussed the possibility of admission to the hospital following CT-guided pleural drainage with intrapleural thrombolytics. They understand that this would require him to be in the hospital for several days at a minimum. At the completion of our interview they were unable to make a decision as to how they would like to proceed. The patient states that he's feeling better and he does need to provide for his family and is reluctant to undergo any invasive procedure that may preclude him getting back to work sooner. I explained that the best time to intervene with either intrapleural thrombolytics or decortication is early on in the course of the disease and that waiting only may delay the success of these additional procedures. They understood. They were very grateful for the time we spent with them. I gave them my business card and told him to contact me as soon as possible once they were able to determine how they would like to proceed. I believe that they had all their questions  answered.       Jesus Marin, MD 08/06/2016, 9:14 AM

## 2016-08-10 NOTE — Progress Notes (Signed)
eLink Physician-Brief Progress Note Patient Name: Jesus NiemannDavid A Trujillo DOB: 10/27/1980 MRN: [id number]   Date of Service  08/10/2016  HPI/Events of Note  36 yo man, admitted to ICU post-op L VATS thoracotomy and decortication for empyema. Stable vitals signs. No evidence distress. Currently on cefuroxime. No cx data available yet. Will follow. No new orders.   eICU Interventions       Intervention Category Evaluation Type: New Patient Evaluation  BYRUM,ROBERT S. 08/10/2016, 3:13 PM

## 2016-08-10 NOTE — Anesthesia Post-op Follow-up Note (Cosign Needed)
Anesthesia QCDR form completed.        

## 2016-08-10 NOTE — Anesthesia Procedure Notes (Signed)
Procedure Name: Intubation Performed by: Lance Muss Pre-anesthesia Checklist: Patient identified, Patient being monitored, Timeout performed, Emergency Drugs available and Suction available Patient Re-evaluated:Patient Re-evaluated prior to inductionOxygen Delivery Method: Circle system utilized Preoxygenation: Pre-oxygenation with 100% oxygen Intubation Type: IV induction Ventilation: Mask ventilation without difficulty and Oral airway inserted - appropriate to patient size Laryngoscope Size: Mac and 4 Grade View: Grade I Tube type: Oral Endobronchial tube: Left, EBT position confirmed by auscultation, EBT position confirmed by fiberoptic bronchoscope and Double lumen EBT and 39 Fr Number of attempts: 1 Airway Equipment and Method: Stylet Placement Confirmation: ETT inserted through vocal cords under direct vision,  positive ETCO2 and breath sounds checked- equal and bilateral Tube secured with: Tape Dental Injury: Teeth and Oropharynx as per pre-operative assessment

## 2016-08-10 NOTE — Progress Notes (Signed)
ANTIBIOTIC CONSULT NOTE - INITIAL  Pharmacy Consult for Unasyn Indication: empyema  No Known Allergies  Patient Measurements: Height: 5\' 11"  (180.3 cm) Weight: 258 lb (117 kg) IBW/kg (Calculated) : 75.3 Adjusted Body Weight:   Vital Signs: Temp: 97.9 F (36.6 C) (05/14 1454) Temp Source: Oral (05/14 1454) BP: 120/79 (05/14 1600) Pulse Rate: 79 (05/14 1600) Intake/Output from previous day: No intake/output data recorded. Intake/Output from this shift: Total I/O In: 1135 [I.V.:1135] Out: 675 [Urine:225; Blood:150; Chest Tube:300]  Labs: No results for input(s): WBC, HGB, PLT, LABCREA, CREATININE in the last 72 hours. Estimated Creatinine Clearance: 160.8 mL/min (by C-G formula based on SCr of 0.85 mg/dL). No results for input(s): VANCOTROUGH, VANCOPEAK, VANCORANDOM, GENTTROUGH, GENTPEAK, GENTRANDOM, TOBRATROUGH, TOBRAPEAK, TOBRARND, AMIKACINPEAK, AMIKACINTROU, AMIKACIN in the last 72 hours.   Microbiology: Recent Results (from the past 720 hour(s))  Blood Culture (routine x 2)     Status: None   Collection Time: 07/11/16  6:01 PM  Result Value Ref Range Status   Specimen Description BLOOD LEFT HAND  Final   Special Requests   Final    BOTTLES DRAWN AEROBIC AND ANAEROBIC Blood Culture adequate volume   Culture NO GROWTH 5 DAYS  Final   Report Status 07/16/2016 FINAL  Final  Blood Culture (routine x 2)     Status: None   Collection Time: 07/11/16  6:01 PM  Result Value Ref Range Status   Specimen Description BLOOD RIGHT ARM  Final   Special Requests   Final    BOTTLES DRAWN AEROBIC AND ANAEROBIC Blood Culture results may not be optimal due to an excessive volume of blood received in culture bottles   Culture NO GROWTH 5 DAYS  Final   Report Status 07/16/2016 FINAL  Final  MRSA PCR Screening     Status: None   Collection Time: 07/13/16  1:35 PM  Result Value Ref Range Status   MRSA by PCR NEGATIVE NEGATIVE Final    Comment:        The GeneXpert MRSA Assay  (FDA approved for NASAL specimens only), is one component of a comprehensive MRSA colonization surveillance program. It is not intended to diagnose MRSA infection nor to guide or monitor treatment for MRSA infections.   Body fluid culture     Status: None   Collection Time: 07/14/16 12:15 PM  Result Value Ref Range Status   Specimen Description PLEURAL  Final   Special Requests NONE  Final   Gram Stain   Final    MODERATE WBC PRESENT, PREDOMINANTLY PMN NO ORGANISMS SEEN    Culture   Final    NO GROWTH 3 DAYS Performed at [facility], [address] [place], [place] [address]    Report Status 07/17/2016 FINAL  Final  Surgical pcr screen     Status: None   Collection Time: 08/07/16  1:48 PM  Result Value Ref Range Status   MRSA, PCR NEGATIVE NEGATIVE Final   Staphylococcus aureus NEGATIVE NEGATIVE Final    Comment:        The Xpert SA Assay (FDA approved for NASAL specimens in patients over 34 years of age), is one component of a comprehensive surveillance program.  Test performance has been validated by [facility] for patients greater than or equal to 216 year old. It is not intended to diagnose infection nor to guide or monitor treatment.   Aerobic/Anaerobic Culture (surgical/deep wound)     Status: None (Preliminary result)   Collection Time: 08/10/16 10:20 AM  Result Value Ref Range Status   Specimen Description TISSUE EMPYEMA LEFT LUNG  Final   Special Requests SWAB SENT  Final   Gram Stain   Final    RARE WBC PRESENT,BOTH PMN AND MONONUCLEAR NO ORGANISMS SEEN Performed at [facility], [address] [place], [place] [address]    Culture PENDING  Incomplete   Report Status PENDING  Incomplete    Medical History: Past Medical History:  Diagnosis Date  . Anxiety   . History of pleurisy 06/2016  . Multifocal pneumonia 06/2016  . SIRS (systemic inflammatory response syndrome) (HCC) 06/2016    Medications:  Prescriptions Prior to  Admission  Medication Sig Dispense Refill Last Dose  . diphenhydrAMINE (BENADRYL) 25 MG tablet Take 25 mg by mouth at bedtime as needed for sleep.   Past Week at Unknown time  . ibuprofen (ADVIL,MOTRIN) 200 MG tablet Take 400-600 mg by mouth daily as needed for headache or moderate pain.   Past Week at Unknown time  . Ipratropium-Albuterol (COMBIVENT RESPIMAT) 20-100 MCG/ACT AERS respimat Inhale 1 puff into the lungs every 6 (six) hours as needed for wheezing. 1 Inhaler 0 08/10/2016 at 0500  . Melatonin 5 MG TABS Take 5 mg by mouth at bedtime as needed (sleep).   Past Month at Unknown time   Scheduled:  . albuterol  2.5 mg Nebulization Q4H while awake  . bisacodyl  10 mg Oral Daily  . famotidine      . midazolam      . traMADol  50 mg Oral Q6H   Assessment: Pharmacy consulted to dose and monitor Unasyn in this 36 year old male being treated for empyema with Unasyn.  Goal of Therapy:    Plan:  Will start Unasyn 3 g IV q6 hours.   Jesus Trujillo D 08/10/2016,5:07 PM

## 2016-08-10 NOTE — Op Note (Signed)
  08/10/2016  1:13 PM  PATIENT:  Jesus Trujillo  36 y.o. male  PRE-OPERATIVE DIAGNOSIS:  Left-sided empyema  POST-OPERATIVE DIAGNOSIS:  Same  PROCEDURE:  Preoperative bronchoscopy to assess endobronchial anatomy with left thoracotomy and decortication of visceral and parietal pleura  SURGEON:  Surgeon(s) and Role:    Hulda Marin* Irish Breisch, MD - Primary    * Lattie Hawooper, Richard E, MD - Assisting  ASSISTANTS: None  ANESTHESIA: Gen.  INDICATIONS FOR PROCEDURE this is a 36 year old gentleman who presented to our institution about a month ago with pleuritic chest pain and was also diagnosed with a left lower lobe pneumonia. During his hospitalization he had a large pleural effusion which was drained showing an empyema. Upon further follow-up it was found that his empyema had not cleared and he was referred for possible surgical intervention. Indications and risks of the procedure were explained to the patient gave his informed consent.  DICTATION: The patient was brought to the operating suite and placed in the supine position. General endotracheal anesthesia was given through a double-lumen tube. Preoperative bronchoscopy was carried out. There is some thin secretions in the left lower lobe but otherwise the bronchoscopy was normal.  The patient was then turned for left thoracotomy. All pressure points were carefully padded. The patient was prepped and draped in usual sterile fashion. A posterior lateral fifth interspace thoracotomy was performed. Initially the serratus muscle was spared however it became clear that for additional exposure of need to be divided. Once the chest was entered we encountered a large amount of fluid which was turbid in the lower aspect of the hemithorax. This was suctioned. There were some small locules a more frank pus. These were also suctioned. We freed up the entire lung using the thoracoscope for visualization at the apex. Once the entire lung was freed up a complete  decortication was performed. There did appear to be some necrotic tissue in the fissure anteriorly but this could not be removed without injuring the lung and therefore was debrided and left alone. The lower lobe ventilated quite nicely and we're complete. The upper lobe was less affected and also ventilated nicely and filled the entire space.  3 chest tubes were inserted. These were 28 Blakes position anteriorly posteriorly and along the diaphragm. The most anterior tube was positioned along the diaphragm. The middle tube was positioned anteriorly and over the apex of the lung in the most posterior tube was placed in the paravertebral space. Were secured to the pleura with interrupted 4-0 chromic.  The chest was then copiously irrigated. Hemostasis was complete and the wounds were then closed. The ribs were reapproximated with #2 Vicryl pericostal sutures. The muscles of the chest wall close with #2 Vicryl pericostal sutures. Septae's tissues with 2-0 Vicryl and the skin with skin clips.  Liposomal bupivacaine was used on the intercostals prior to closure. The remainder was used along the chest tube sites and along the incision itself.  Sterile dressings were applied. The patient was then explained and taken to the recovery room in stable condition.   Hulda Marinimothy Kimsey Demaree, MD

## 2016-08-10 NOTE — Consult Note (Signed)
Name: Jesus NiemannDavid A Trujillo MRN: [id number] DOB: 18-Oct-1980    ADMISSION DATE:  08/10/2016 CONSULTATION DATE:  08/10/2016   REFERRING MD : Dr. Thelma Bargeaks post op care   HISTORY OF PRESENT ILLNESS:   36 y.o. male.  He was admitted to the hospital several weeks ago with severe left-sided pleuritis and an associated pneumonia with small pleural effusion. He was placed on intravenous antibiotics and over the course of the next several days improved with his pain and also underwent an ultrasound-guided thoracentesis. The cultures were negative on the pleural effusion but it did demonstrate approximately 15,000 white blood cells most of which were neutrophils. The patient was discharged to home and followed up with his primary care provider the following day. They continued his outpatient oral antibiotics and several weeks later he followed up with Dr. Darrol AngelSimons in pulmonary medicine. A repeat chest x-ray showed a left pleural effusion and an ultrasound-guided thoracentesis was aborted because of the multiloculated nature of the fluid.  S/p decortication reveals PUS Chest tube in place case discussed with dr Thelma Bargeaks   PAST MEDICAL HISTORY :   has a past medical history of Anxiety; History of pleurisy (06/2016); Multifocal pneumonia (06/2016); and SIRS (systemic inflammatory response syndrome) (HCC) (06/2016).  has a past surgical history that includes plueralcentesis. Prior to Admission medications   Medication Sig Start Date End Date Taking? Authorizing Provider  diphenhydrAMINE (BENADRYL) 25 MG tablet Take 25 mg by mouth at bedtime as needed for sleep.   Yes [provider]  ibuprofen (ADVIL,MOTRIN) 200 MG tablet Take 400-600 mg by mouth daily as needed for headache or moderate pain.   Yes [provider]  Ipratropium-Albuterol (COMBIVENT RESPIMAT) 20-100 MCG/ACT AERS respimat Inhale 1 puff into the lungs every 6 (six) hours as needed for wheezing. 07/15/16  Yes Delfino LovettShah, Vipul, MD  Melatonin 5  MG TABS Take 5 mg by mouth at bedtime as needed (sleep).   Yes [provider]   No Known Allergies  FAMILY HISTORY:  He was adopted. Family history is unknown by patient. SOCIAL HISTORY:  reports that he has never smoked. He has never used smokeless tobacco. He reports that he drinks alcohol. He reports that he does not use drugs.  REVIEW OF SYSTEMS:   Constitutional: Negative for fever, chills, weight loss, malaise/fatigue and diaphoresis.  HENT: Negative for hearing loss, ear pain, nosebleeds, congestion, sore throat, neck pain, tinnitus and ear discharge.   Eyes: Negative for blurred vision, double vision, photophobia, pain, discharge and redness.  Respiratory: Negative for cough, hemoptysis, sputum production, shortness of breath, wheezing and stridor.   Cardiovascular: Negative for chest pain, palpitations, orthopnea, claudication, leg swelling and PND.  Gastrointestinal: Negative for heartburn, nausea, vomiting, abdominal pain, diarrhea, constipation, blood in stool and melena.  Genitourinary: Negative for dysuria, urgency, frequency, hematuria and flank pain.  Musculoskeletal: Negative for myalgias, back pain, joint pain and falls.  Skin: Negative for itching and rash.  Neurological: Negative for dizziness, tingling, tremors, sensory change, speech change, focal weakness, seizures, loss of consciousness, weakness and headaches.  Endo/Heme/Allergies: Negative for environmental allergies and polydipsia. Does not bruise/bleed easily.   VITAL SIGNS: Temp:  [96.9 F (36.1 C)-97.9 F (36.6 C)] 97.9 F (36.6 C) (05/14 1454) Pulse Rate:  [98-114] 99 (05/14 1425) Resp:  [12-30] 12 (05/14 1425) BP: (124-145)/(63-89) 137/83 (05/14 1425) SpO2:  [94 %-99 %] 95 % (05/14 1425) Weight:  [258 lb (117 kg)] 258 lb (117 kg) (05/14 0724)  PHYSICAL EXAMINATION: Physical Examination:  GENERAL:NAD, no fevers, chills, +weakness HEAD: Normocephalic, atraumatic.  EYES: Pupils equal,  round, reactive to light. Extraocular muscles intact. No scleral icterus.  MOUTH: Moist mucosal membrane. Dentition intact. No abscess noted.  EAR, NOSE, THROAT: Clear without exudates. No external lesions.  NECK: Supple. No thyromegaly. No nodules. No JVD.  PULMONARY: no wheezes, chest tube in place CARDIOVASCULAR: S1 and S2. Regular rate and rhythm. No murmurs, rubs, or gallops. No edema. Pedal pulses 2+ bilaterally.  ALL OTHER ROS ARE NEGATIVE      Recent Labs Lab 08/07/16 1348  NA 137  K 4.0  CL 102  CO2 27  BUN 15  CREATININE 0.85  GLUCOSE 101*    Recent Labs Lab 08/07/16 1348  HGB 13.1  HCT 39.3*  WBC 9.4  PLT 362   Dg Chest Port 1 View  Result Date: 08/10/2016 CLINICAL DATA:  Status post left thoracotomy EXAM: PORTABLE CHEST 1 VIEW COMPARISON:  Chest radiograph Aug 05, 2016 and chest CT Aug 06, 2016 FINDINGS: Chest tubes are present on the left. No pneumothorax. There is resolution of most of the left pleural effusion compared to recent studies. There is airspace consolidation in the left mid lower lung zones. Prep right lung is clear except for slight right base atelectasis. Heart is borderline enlarged with pulmonary vascularity within normal limits. No adenopathy. No bone lesions. IMPRESSION: Chest tubes on the left without pneumothorax. Consolidation throughout much of the left mid and lower lung zones, likely due to compressive atelectasis, likely with superimposed degree of pneumonia. Most of left pleural effusion has resolved. Right lung clear except for slight right base atelectasis. Stable cardiac silhouette. No adenopathy appreciable. Electronically Signed   By: Bretta Bang III M.D.   On: 08/10/2016 14:28    ASSESSMENT / PLAN:  36 yo with left sided loculated effusion s/p decortication findings c/w empyema  1.will start IV abx as per ID 2.morphine as needed 3.incentive spirometry 4.out of bed to chair as tolerated  No further interventions at this  time, follow up Dr Thelma Barge Recommendations   Webb Weed Santiago Glad, M.D.  [facility]  Medical Director [facility] Medical Director [facility] Cardio-Pulmonary Department

## 2016-08-10 NOTE — Transfer of Care (Signed)
Immediate Anesthesia Transfer of Care Note  Patient: Jesus NiemannDavid A Trujillo  Procedure(s) Performed: Procedure(s): PREOP BRONCHOSCOPY (N/A) VIDEO ASSISTED THORACOSCOPY (Left) THORACOTOMY MAJOR WITH DECORTICATION (Left)  Patient Location: PACU  Anesthesia Type:General  Level of Consciousness: sedated and responds to stimulation  Airway & Oxygen Therapy: Patient Spontanous Breathing and Patient connected to face mask oxygen  Post-op Assessment: Report given to RN and Post -op Vital signs reviewed and stable  Post vital signs: Reviewed and stable  Last Vitals:  Vitals:   08/10/16 0724 08/10/16 1305  BP: (!) 145/86 124/63  Pulse: (!) 103 (!) 103  Resp: 14 (!) 23  Temp: 36.4 C 36.4 C    Last Pain:  Vitals:   08/10/16 0724  TempSrc: Tympanic         Complications: No apparent anesthesia complications

## 2016-08-10 NOTE — Anesthesia Preprocedure Evaluation (Signed)
Anesthesia Evaluation  Patient identified by MRN, date of birth, ID band Patient awake    Reviewed: Allergy & Precautions, NPO status , Patient's Chart, lab work & pertinent test results, reviewed documented beta blocker date and time   Airway Mallampati: III  TM Distance: >3 FB     Dental  (+) Chipped   Pulmonary           Cardiovascular      Neuro/Psych Anxiety    GI/Hepatic   Endo/Other    Renal/GU      Musculoskeletal   Abdominal   Peds  Hematology   Anesthesia Other Findings Lung ca.  Reproductive/Obstetrics                             Anesthesia Physical Anesthesia Plan  ASA: III  Anesthesia Plan: General   Post-op Pain Management:    Induction: Intravenous  Airway Management Planned: Double Lumen EBT  Additional Equipment:   Intra-op Plan:   Post-operative Plan:   Informed Consent: I have reviewed the patients History and Physical, chart, labs and discussed the procedure including the risks, benefits and alternatives for the proposed anesthesia with the patient or authorized representative who has indicated his/her understanding and acceptance.     Plan Discussed with: CRNA  Anesthesia Plan Comments:         Anesthesia Quick Evaluation

## 2016-08-10 NOTE — Progress Notes (Signed)
Dr Thelma Bargeoaks by to see pt  Chest tube draining bright red approx 300cc   Pt has been having a lot of pain  Very sweaty

## 2016-08-11 ENCOUNTER — Telehealth: Payer: Self-pay

## 2016-08-11 ENCOUNTER — Inpatient Hospital Stay: Payer: BC Managed Care – PPO

## 2016-08-11 ENCOUNTER — Encounter: Payer: Self-pay | Admitting: Cardiothoracic Surgery

## 2016-08-11 LAB — BASIC METABOLIC PANEL
Anion gap: 9 (ref 5–15)
BUN: 9 mg/dL (ref 6–20)
CHLORIDE: 102 mmol/L (ref 101–111)
CO2: 25 mmol/L (ref 22–32)
Calcium: 8.4 mg/dL — ABNORMAL LOW (ref 8.9–10.3)
Creatinine, Ser: 0.76 mg/dL (ref 0.61–1.24)
GFR calc Af Amer: >60 mL/min (ref >60)
GFR calc non Af Amer: >60 mL/min (ref >60)
GLUCOSE: 121 mg/dL — AB (ref 65–99)
Potassium: 3.9 mmol/L (ref 3.5–5.1)
Sodium: 136 mmol/L (ref 135–145)

## 2016-08-11 LAB — GLUCOSE, CAPILLARY: Glucose-Capillary: 134 mg/dL — ABNORMAL HIGH (ref 65–99)

## 2016-08-11 LAB — CBC
HEMATOCRIT: 38.9 % — AB (ref 40.0–52.0)
Hemoglobin: 12.7 g/dL — ABNORMAL LOW (ref 13.0–18.0)
MCH: 27.9 pg (ref 26.0–34.0)
MCHC: 32.6 g/dL (ref 32.0–36.0)
MCV: 85.5 fL (ref 80.0–100.0)
Platelets: 292 10*3/uL (ref 150–440)
RBC: 4.56 MIL/uL (ref 4.40–5.90)
RDW: 13.9 % (ref 11.5–14.5)
WBC: 13.9 10*3/uL — ABNORMAL HIGH (ref 3.8–10.6)

## 2016-08-11 MED ORDER — GABAPENTIN 100 MG PO CAPS
100.0000 mg | ORAL_CAPSULE | Freq: Three times a day (TID) | ORAL | Status: DC
  Administered 2016-08-11 (×3): 100 mg via ORAL
  Filled 2016-08-11 (×4): qty 1

## 2016-08-11 MED ORDER — TRAMADOL HCL 50 MG PO TABS
100.0000 mg | ORAL_TABLET | Freq: Four times a day (QID) | ORAL | Status: DC
  Administered 2016-08-11 – 2016-08-17 (×26): 100 mg via ORAL
  Filled 2016-08-11 (×27): qty 2

## 2016-08-11 MED ORDER — KETOROLAC TROMETHAMINE 30 MG/ML IJ SOLN
30.0000 mg | Freq: Three times a day (TID) | INTRAMUSCULAR | Status: DC | PRN
  Administered 2016-08-11 – 2016-08-13 (×3): 30 mg via INTRAVENOUS
  Filled 2016-08-11 (×3): qty 1

## 2016-08-11 MED ORDER — MORPHINE SULFATE (PF) 2 MG/ML IV SOLN: 2.0000 mg | INTRAVENOUS | Status: DC | PRN

## 2016-08-11 MED ORDER — MORPHINE SULFATE (PF) 4 MG/ML IV SOLN: 4.0000 mg | INTRAVENOUS | Status: DC | PRN

## 2016-08-11 MED ORDER — MORPHINE SULFATE (PF) 2 MG/ML IV SOLN
2.0000 mg | INTRAVENOUS | Status: DC | PRN
  Administered 2016-08-11 – 2016-08-13 (×4): 2 mg via INTRAVENOUS
  Filled 2016-08-11 (×4): qty 1

## 2016-08-11 MED ORDER — MORPHINE SULFATE (PF) 4 MG/ML IV SOLN
4.0000 mg | INTRAVENOUS | Status: DC | PRN
  Administered 2016-08-11 (×5): 4 mg via INTRAVENOUS
  Filled 2016-08-11 (×5): qty 1

## 2016-08-11 MED ORDER — KETOROLAC TROMETHAMINE 30 MG/ML IJ SOLN
30.0000 mg | Freq: Four times a day (QID) | INTRAMUSCULAR | Status: DC | PRN
  Administered 2016-08-11: 30 mg via INTRAVENOUS
  Filled 2016-08-11: qty 1

## 2016-08-11 MED ORDER — KETOROLAC TROMETHAMINE 30 MG/ML IJ SOLN: 15.0000 mg | Freq: Four times a day (QID) | INTRAMUSCULAR | Status: DC | PRN

## 2016-08-11 MED ORDER — ENOXAPARIN SODIUM 40 MG/0.4ML ~~LOC~~ SOLN
40.0000 mg | SUBCUTANEOUS | Status: DC
  Administered 2016-08-11 – 2016-08-16 (×6): 40 mg via SUBCUTANEOUS
  Filled 2016-08-11 (×6): qty 0.4

## 2016-08-11 MED ORDER — OXYCODONE HCL 5 MG PO TABS
10.0000 mg | ORAL_TABLET | ORAL | Status: DC | PRN
  Administered 2016-08-11 – 2016-08-12 (×3): 10 mg via ORAL
  Filled 2016-08-11 (×2): qty 2
  Filled 2016-08-11: qty 1

## 2016-08-11 MED ORDER — ORAL CARE MOUTH RINSE: 15.0000 mL | Freq: Two times a day (BID) | OROMUCOSAL | Status: DC

## 2016-08-11 NOTE — Consult Note (Signed)
Name: Jesus NiemannDavid A Trujillo MRN: [id number] DOB: [birth year]    ADMISSION DATE:  08/10/2016 CONSULTATION DATE:  08/11/2016   REFERRING MD : Dr. Thelma Bargeaks post op care   Synopsis:   36 y.o. male recent pneumonia with small pleural effusion. Now complicated by loculation- empyema  S/p decortication reveals PUS  REVIEW OF SYSTEMS:   Constitutional: Negative for fever, chills, weight loss, malaise/fatigue and diaphoresis.  HENT: Negative for hearing loss, ear pain, nosebleeds, congestion, sore throat, neck pain, tinnitus and ear discharge.   Eyes: Negative for blurred vision, double vision, photophobia, pain, discharge and redness.  Respiratory: Negative for cough, hemoptysis, sputum production, shortness of breath, wheezing and stridor.   Cardiovascular: Negative for chest pain, palpitations, orthopnea, claudication, leg swelling and PND.  Gastrointestinal: Negative for heartburn, nausea, vomiting, abdominal pain, diarrhea, constipation, blood in stool and melena.  Genitourinary: Negative for dysuria, urgency, frequency, hematuria and flank pain.  Musculoskeletal: Negative for myalgias, back pain, joint pain and falls.  Skin: Negative for itching and rash.  Neurological: Negative for dizziness, tingling, tremors, sensory change, speech change, focal weakness, seizures, loss of consciousness, weakness and headaches.  Endo/Heme/Allergies: Negative for environmental allergies and polydipsia. Does not bruise/bleed easily.   VITAL SIGNS: Temp:  [96.9 F (36.1 C)-97.9 F (36.6 C)] 97.7 F (36.5 C) (05/15 0500) Pulse Rate:  [77-114] 94 (05/15 0500) Resp:  [12-36] 20 (05/15 0500) BP: (112-137)/(63-89) 125/79 (05/15 0500) SpO2:  [94 %-99 %] 94 % (05/15 0500)  PHYSICAL EXAMINATION: Physical Examination:   GENERAL:NAD, no fevers, chills, +weakness HEAD: Normocephalic, atraumatic.  EYES: Pupils equal, round, reactive to light. Extraocular muscles intact. No scleral icterus.  MOUTH: Moist  mucosal membrane. Dentition intact. No abscess noted.  EAR, NOSE, THROAT: Clear without exudates. No external lesions.  NECK: Supple. No thyromegaly. No nodules. No JVD.  PULMONARY: no wheezes, lung CTA, chest tube in place CARDIOVASCULAR: S1 and S2. Regular rate and rhythm. No murmurs, rubs, or gallops. No edema. Pedal pulses 2+ bilaterally.  ALL OTHER ROS ARE NEGATIVE      Recent Labs Lab 08/07/16 1348  NA 137  K 4.0  CL 102  CO2 27  BUN 15  CREATININE 0.85  GLUCOSE 101*    Recent Labs Lab 08/07/16 1348  HGB 13.1  HCT 39.3*  WBC 9.4  PLT 362   Dg Chest Port 1 View  Result Date: 08/11/2016 CLINICAL DATA:  Left-sided chest tube insertion. EXAM: PORTABLE CHEST 1 VIEW COMPARISON:  08/10/2016 . FINDINGS: Left chest tubes are in stable position. No pneumothorax. Heart size stable. Persistent bibasilar atelectasis and consolidation left lower lobe. No prominent pleural effusion. No pneumothorax . IMPRESSION: 1. Left chest tubes in stable position. 2. Persistent basilar atelectasis and consolidation left lower lobe. Electronically Signed   By: Maisie Fushomas  Register   On: 08/11/2016 07:26   Dg Chest Port 1 View  Result Date: 08/10/2016 CLINICAL DATA:  Status post left thoracotomy EXAM: PORTABLE CHEST 1 VIEW COMPARISON:  Chest radiograph Aug 05, 2016 and chest CT Aug 06, 2016 FINDINGS: Chest tubes are present on the left. No pneumothorax. There is resolution of most of the left pleural effusion compared to recent studies. There is airspace consolidation in the left mid lower lung zones. Prep right lung is clear except for slight right base atelectasis. Heart is borderline enlarged with pulmonary vascularity within normal limits. No adenopathy. No bone lesions. IMPRESSION: Chest tubes on the left without pneumothorax. Consolidation throughout much of the left mid and lower lung zones, likely  due to compressive atelectasis, likely with superimposed degree of pneumonia. Most of left pleural  effusion has resolved. Right lung clear except for slight right base atelectasis. Stable cardiac silhouette. No adenopathy appreciable. Electronically Signed   By: Bretta Bang III M.D.   On: 08/10/2016 14:28    ASSESSMENT / PLAN:  36 yo with left sided loculated effusion s/p decortication findings c/w empyema  1.Continue IV abx as per ID 2.morphine as needed 3.incentive spirometry 4.out of bed to chair as tolerated  Follow up Dr Thelma Barge Recommendations   Wells Guiles, M.D.  [facility]

## 2016-08-11 NOTE — Progress Notes (Signed)
Pt has remained alert and oriented with c/o 8-9/10 sharp L chest pain. Pain has been managed by PRN morphine/tramodol/oxy. Upon pain assessment pt is ST in to low 100s, RR 35-40 with verbalized pain. Wife has remained at bedside. Pt has remained on [facility] for pt comfort. Lung sounds clear bilaterally, diminished on the L side. Chest tube has remained at -20cm suction, bright-red output, no leak identified. Pt has refused breakfast and lunch today d/t fear of pain. PT has been consulted, but has not evaluated pt as of yet. Pt is very reluctant to move in the bed other than his arms. Pt with orders to transfer to the floor.

## 2016-08-11 NOTE — Evaluation (Addendum)
Physical Therapy Evaluation Patient Details Name: Jesus Trujillo MRN: [id number] DOB: May 15, 1980 Today's Date: 08/11/2016   History of Present Illness  admitted for acute hospitalization status post VATS procedure with thoracotomy and decortication of L lung secondary to empyema.  Clinical Impression  Patient requiring extensive encouragement for effort/participation with session; limited by pain, 8-9/10 in L flank, despite pre-medication.  Currently requiring mod/max assist for bed mobility (extremely guarded); min/mod assist +2 for sit/stand, bed/chair transfer with RW.  Refused further distance due to pain.  Will continue to progress gait distance next session. Does require 2L supplemental O2 to maintain sats >90% with exertion. Would benefit from skilled PT to address above deficits and promote optimal return to PLOF; Recommend transition to HHPT upon discharge from acute hospitalization.   Chest tube transitioned to water seal per RN to allow for participation with session; RN to re-connect suction end of session   Follow Up Recommendations Home health PT    Equipment Recommendations       Recommendations for Other Services       Precautions / Restrictions Precautions Precautions: Fall Precaution Comments: L chest tube x3 Restrictions Weight Bearing Restrictions: No      Mobility  Bed Mobility Overal bed mobility: Needs Assistance Bed Mobility: Supine to Sit     Supine to sit: Max assist;+2 for physical assistance     General bed mobility comments: extensive encouragement and assist requried to complete due  to pain  Transfers Overall transfer level: Needs assistance Equipment used: Rolling walker (2 wheeled) Transfers: Sit to/from Stand Sit to Stand: Min assist;Mod assist;+2 physical assistance            Ambulation/Gait Ambulation/Gait assistance: Min assist;Mod assist;+2 physical assistance Ambulation Distance (Feet): 5 Feet Assistive device:  Rolling walker (2 wheeled)       General Gait Details: good strength/stability in bilat LEs; mod bracing on RW due to pain.  Refused further distance/activity  Stairs            Wheelchair Mobility    Modified Rankin (Stroke Patients Only)       Balance Overall balance assessment: Needs assistance Sitting-balance support: Bilateral upper extremity supported;No upper extremity supported Sitting balance-Leahy Scale: Good     Standing balance support: Bilateral upper extremity supported Standing balance-Leahy Scale: Fair                               Pertinent Vitals/Pain Pain Assessment: 0-10 Pain Score: 8  Pain Location:  L flank Pain Descriptors / Indicators: Aching;Moaning;Sore;Operative site guarding Pain Intervention(s): Limited activity within patient's tolerance;Monitored during session;Premedicated before session;Repositioned    Home Living Family/patient expects to be discharged to:: Private residence Living Arrangements: Spouse/significant other Available Help at Discharge: Family Type of Home: House Home Access: Stairs to enter   Secretary/administrator of Steps: 3-4 Home Layout: One level Home Equipment: None      Prior Function Level of Independence: Independent         Comments: Indep with ADLs, household and community mobility; active father of 3     Hand Dominance        Extremity/Trunk Assessment   Upper Extremity Assessment Upper Extremity Assessment:  (L UE elevation limited in all planes due to pain; otherwise, grossly [facility])    Lower Extremity Assessment Lower Extremity Assessment: Overall WFL for tasks assessed       Communication   Communication: No difficulties  Cognition Arousal/Alertness: Awake/alert  Behavior During Therapy: [facility] Health[facility]FL for tasks assessed/performed;Anxious Overall Cognitive Status: Within Functional Limits for tasks assessed                                        General  Comments      Exercises     Assessment/Plan    PT Assessment Patient needs continued PT services  PT Problem List Decreased range of motion;Decreased activity tolerance;Decreased balance;Decreased mobility;Decreased knowledge of use of DME;Decreased safety awareness;Decreased knowledge of precautions;Pain;Decreased skin integrity       PT Treatment Interventions DME instruction;Gait training;Stair training;Functional mobility training;Therapeutic activities;Therapeutic exercise;Balance training;Patient/family education    PT Goals (Current goals can be found in the Care Plan section)  Acute Rehab PT Goals Patient Stated Goal: agreeable to OOB attempts with max encouragement PT Goal Formulation: With patient/family Time For Goal Achievement: 08/25/16 Potential to Achieve Goals: Good    Frequency Min 2X/week   Barriers to discharge        Co-evaluation               AM-PAC PT "6 Clicks" Daily Activity  Outcome Measure Difficulty turning over in bed (including adjusting bedclothes, sheets and blankets)?: Total Difficulty moving from lying on back to sitting on the side of the bed? : Total Difficulty sitting down on and standing up from a chair with arms (e.g., wheelchair, bedside commode, etc,.)?: Total Help needed moving to and from a bed to chair (including a wheelchair)?: A Lot Help needed walking in hospital room?: A Lot Help needed climbing 3-5 steps with a railing? : Total 6 Click Score: 8    End of Session Equipment Utilized During Treatment: Oxygen Activity Tolerance: Patient limited by pain Patient left: in chair;with call bell/phone within reach;with chair alarm set;with family/visitor present Nurse Communication: Mobility status PT Visit Diagnosis: Difficulty in walking, not elsewhere classified (R26.2)    Time: [id number] PT Time Calculation (min) (ACUTE ONLY): 27 min   Charges:   PT Evaluation $PT Eval Moderate Complexity: 1 Procedure PT  Treatments $Therapeutic Activity: 8-22 mins   PT G Codes:       Lanea Vankirk H. Manson PasseyBrown, PT, DPT, NCS 08/11/16, 9:58 PM [phone]

## 2016-08-11 NOTE — Progress Notes (Signed)
[facility] NOTE Date of Admission:  08/10/2016     ID: Jesus Trujillo is a 36 y.o. male with  empyema Active Problems:   Empyema of left pleural space (HCC)   Subjective: Out of unit had temp to 100.8  ROS  Eleven systems are reviewed and negative except per hpi  Medications:  Antibiotics Given (last 72 hours)    Date/Time Action Medication Dose Rate   08/10/16 0922 New Bag/Given   cefUROXime (ZINACEF) 1.5 g in dextrose 5 % 50 mL IVPB 1.5 g    08/10/16 1742 New Bag/Given   Ampicillin-Sulbactam (UNASYN) 3 g in sodium chloride 0.9 % 100 mL IVPB 3 g 200 mL/hr   08/10/16 2134 New Bag/Given   cefUROXime (ZINACEF) 1.5 g in dextrose 5 % 50 mL IVPB 1.5 g 100 mL/hr   08/10/16 2242 New Bag/Given   Ampicillin-Sulbactam (UNASYN) 3 g in sodium chloride 0.9 % 100 mL IVPB 3 g 200 mL/hr   08/11/16 [id number] New Bag/Given   Ampicillin-Sulbactam (UNASYN) 3 g in sodium chloride 0.9 % 100 mL IVPB 3 g 200 mL/hr   08/11/16 1105 New Bag/Given   Ampicillin-Sulbactam (UNASYN) 3 g in sodium chloride 0.9 % 100 mL IVPB 3 g 200 mL/hr     . albuterol  2.5 mg Nebulization Q4H while awake  . bisacodyl  10 mg Oral Daily  . enoxaparin (LOVENOX) injection  40 mg Subcutaneous Q24H  . gabapentin  100 mg Oral TID  . traMADol  100 mg Oral Q6H    Objective: Vital signs in last 24 hours: Temp:  [97.4 F (36.3 C)-100.6 F (38.1 C)] 97.4 F (36.3 C) (05/15 1456) Pulse Rate:  [77-117] 117 (05/15 1230) Resp:  [19-40] 40 (05/15 1000) BP: (112-137)/(70-86) 137/80 (05/15 1230) SpO2:  [92 %-99 %] 92 % (05/15 1230)   Lab Results  Recent Labs  08/11/16 1307  WBC 13.9*  HGB 12.7*  HCT 38.9*  NA 136  K 3.9  CL 102  CO2 25  BUN 9  CREATININE 0.76    Microbiology: Results for orders placed or performed during the hospital encounter of 08/10/16  Aerobic/Anaerobic Culture (surgical/deep wound)     Status: None (Preliminary result)   Collection Time: 08/10/16 10:20 AM   Result Value Ref Range Status   Specimen Description TISSUE EMPYEMA LEFT LUNG  Final   Special Requests SWAB SENT  Final   Gram Stain   Final    RARE WBC PRESENT,BOTH PMN AND MONONUCLEAR NO ORGANISMS SEEN    Culture   Final    NO GROWTH 1 DAY Performed at [facility], [address] [place], [place] [address]    Report Status PENDING  Incomplete  Acid Fast Smear (AFB)     Status: None (Preliminary result)   Collection Time: 08/10/16 10:23 AM  Result Value Ref Range Status   AFB Specimen Processing Concentration  Final   Acid Fast Smear Negative  Final    Comment: (NOTE) Performed At: [facility] [address] [place], [place] [id number] Mila HomerHancock William F MD NW:[phone]:[phone]    Source (AFB) PENDING  Incomplete  MRSA PCR Screening     Status: None   Collection Time: 08/10/16  2:30 PM  Result Value Ref Range Status   MRSA by PCR NEGATIVE NEGATIVE Final    Comment:        The GeneXpert MRSA Assay (FDA approved for NASAL specimens only), is one component of a comprehensive MRSA colonization surveillance  program. It is not intended to diagnose MRSA infection nor to guide or monitor treatment for MRSA infections.     Studies/Results: Dg Chest Port 1 View  Result Date: 08/11/2016 CLINICAL DATA:  Left-sided chest tube insertion. EXAM: PORTABLE CHEST 1 VIEW COMPARISON:  08/10/2016 . FINDINGS: Left chest tubes are in stable position. No pneumothorax. Heart size stable. Persistent bibasilar atelectasis and consolidation left lower lobe. No prominent pleural effusion. No pneumothorax . IMPRESSION: 1. Left chest tubes in stable position. 2. Persistent basilar atelectasis and consolidation left lower lobe. Electronically Signed   By: Maisie Fus  Register   On: 08/11/2016 07:26   Dg Chest Port 1 View  Result Date: 08/10/2016 CLINICAL DATA:  Status post left thoracotomy EXAM: PORTABLE CHEST 1 VIEW COMPARISON:  Chest radiograph Aug 05, 2016 and chest CT Aug 06, 2016 FINDINGS:  Chest tubes are present on the left. No pneumothorax. There is resolution of most of the left pleural effusion compared to recent studies. There is airspace consolidation in the left mid lower lung zones. Prep right lung is clear except for slight right base atelectasis. Heart is borderline enlarged with pulmonary vascularity within normal limits. No adenopathy. No bone lesions. IMPRESSION: Chest tubes on the left without pneumothorax. Consolidation throughout much of the left mid and lower lung zones, likely due to compressive atelectasis, likely with superimposed degree of pneumonia. Most of left pleural effusion has resolved. Right lung clear except for slight right base atelectasis. Stable cardiac silhouette. No adenopathy appreciable. Electronically Signed   By: Bretta Bang III M.D.   On: 08/10/2016 14:28    Assessment/Plan: Jesus Trujillo is a 36 y.o. male with Empyema following episode of CAP now s/Trujillo decortication 5/14 Cultures are pending from OR. Prior bcx and pleural fluid cx neg but no sputum cx done. Leg and strep PNA urine ags were negative at initial admission. HIV neg. MRSA PCR. Recommendations I would rec contineu unasyn at this point. Will likely need prolonged abx but if cultures negative for resistant pathogens can likely dc on prolonged oral augmentin.  Plan would be to treat until compelte radiological resolution.  Thank you very much for the consult. Will follow with you.  Jesus Trujillo, Jesus Trujillo   08/11/2016, 3:35 PM

## 2016-08-11 NOTE — Plan of Care (Signed)
Problem: Safety: Goal: Ability to remain free from injury will improve Outcome: Progressing Patient has not experienced any adverse events throughout this shift.

## 2016-08-11 NOTE — Telephone Encounter (Signed)
CPT codes 31625,[id number] [phone] and ICD 10-J86.9 appoved per Verlan FriendsAngela F.

## 2016-08-11 NOTE — Anesthesia Postprocedure Evaluation (Signed)
Anesthesia Post Note  Patient: Jesus Trujillo  Procedure(s) Performed: Procedure(s) (LRB): PREOP BRONCHOSCOPY (N/A) VIDEO ASSISTED THORACOSCOPY (Left) THORACOTOMY MAJOR WITH DECORTICATION (Left)  Patient location during evaluation: SICU Anesthesia Type: General Level of consciousness: awake and alert Pain management: pain level controlled Vital Signs Assessment: post-procedure vital signs reviewed and stable Respiratory status: spontaneous breathing and nonlabored ventilation Cardiovascular status: stable Postop Assessment: no signs of nausea or vomiting Anesthetic complications: no     Last Vitals:  Vitals:   08/11/16 0400 08/11/16 0500  BP: 118/75 125/79  Pulse: 85 94  Resp: (!) 21 20  Temp:  36.5 C    Last Pain:  Vitals:   08/11/16 0533  TempSrc:   PainSc: 3                  Jesus Trujillo,  Jesus Trujillo

## 2016-08-11 NOTE — Care Management (Signed)
Patient had [facility] admission approximately one month ago for pneumonia and presented for for scheduled bronchoscopy to evaluate left empyema.  Required thoracotomy; decortication, and insertion of three chest tubes. ID consulting and at present, there does not appear to be indication for long term IV antibiotics but cultures pending

## 2016-08-12 LAB — BASIC METABOLIC PANEL
Anion gap: 8 (ref 5–15)
BUN: 10 mg/dL (ref 6–20)
CALCIUM: 8.3 mg/dL — AB (ref 8.9–10.3)
CO2: 28 mmol/L (ref 22–32)
CREATININE: 0.74 mg/dL (ref 0.61–1.24)
Chloride: 97 mmol/L — ABNORMAL LOW (ref 101–111)
GFR calc Af Amer: >60 mL/min (ref >60)
GLUCOSE: 112 mg/dL — AB (ref 65–99)
Potassium: 3.6 mmol/L (ref 3.5–5.1)
SODIUM: 133 mmol/L — AB (ref 135–145)

## 2016-08-12 LAB — CBC
HCT: 35.8 % — ABNORMAL LOW (ref 40.0–52.0)
Hemoglobin: 12.1 g/dL — ABNORMAL LOW (ref 13.0–18.0)
MCH: 28.6 pg (ref 26.0–34.0)
MCHC: 33.7 g/dL (ref 32.0–36.0)
MCV: 85 fL (ref 80.0–100.0)
PLATELETS: 291 10*3/uL (ref 150–440)
RBC: 4.22 MIL/uL — AB (ref 4.40–5.90)
RDW: 13.8 % (ref 11.5–14.5)
WBC: 15.4 10*3/uL — ABNORMAL HIGH (ref 3.8–10.6)

## 2016-08-12 LAB — PHOSPHORUS: Phosphorus: 3.6 mg/dL (ref 2.5–4.6)

## 2016-08-12 LAB — MAGNESIUM: MAGNESIUM: 1.7 mg/dL (ref 1.7–2.4)

## 2016-08-12 LAB — SURGICAL PATHOLOGY

## 2016-08-12 MED ORDER — GABAPENTIN 100 MG PO CAPS
200.0000 mg | ORAL_CAPSULE | Freq: Three times a day (TID) | ORAL | Status: DC
  Administered 2016-08-12 – 2016-08-17 (×17): 200 mg via ORAL
  Filled 2016-08-12 (×13): qty 2
  Filled 2016-08-12: qty 1
  Filled 2016-08-12 (×4): qty 2

## 2016-08-12 MED ORDER — OXYCODONE HCL 5 MG PO TABS
15.0000 mg | ORAL_TABLET | ORAL | Status: DC | PRN
  Administered 2016-08-12 – 2016-08-17 (×14): 15 mg via ORAL
  Filled 2016-08-12 (×11): qty 3
  Filled 2016-08-12: qty 1
  Filled 2016-08-12 (×4): qty 3

## 2016-08-12 NOTE — Progress Notes (Signed)
He still continues to have some discomfort. He was able to sit in a chair yesterday and did walk in the room.  He has had a poor appetite although this morning he states that he does feel hungry. His urine output has been good. He's been afebrile.  I changed his dressings today. His wounds are clean dry and intact. We recovered them. He has a very small air leak. This was intermittent. The chest tube has drained about 150 cc of serosanguineous fluid.  His lungs are diminished on the left compared to the right. His heart regular.  I have adjusted his pain medication. I will discontinue his Foley catheter We will continue the intravenous fluids until he is tolerating a diet. Cultures are all negative to date and we will recheck those. We will repeat a chest x-ray tomorrow.

## 2016-08-12 NOTE — Progress Notes (Signed)
Physical Therapy Treatment Patient Details Name: Jesus NiemannDavid A Trujillo MRN: [id number] DOB: 1981-03-20 Today's Date: 08/12/2016    History of Present Illness admitted for acute hospitalization status post VATS procedure with thoracotomy and decortication of L lung secondary to empyema.    PT Comments    Progressive increase in gait distance and overall functional performance this date.  Able to complete OOB, transfers and gait (220') with RW, no greater than min/mod assist from therapist.  Continues to require extensive encouragement for initiation of any/all functional activities. Instructed patient in OOB for all meals and gait around nursing station with nursing staff this PM; patient/wife voiced understanding.    Follow Up Recommendations  Home health PT     Equipment Recommendations  Rolling walker with 5" wheels    Recommendations for Other Services       Precautions / Restrictions Precautions Precautions: Fall Precaution Comments: L chest tube x3 Restrictions Weight Bearing Restrictions: No    Mobility  Bed Mobility Overal bed mobility: Needs Assistance Bed Mobility: Supine to Sit     Supine to sit: Mod assist        Transfers Overall transfer level: Needs assistance Equipment used: Rolling walker (2 wheeled) Transfers: Sit to/from Stand Sit to Stand: Min assist            Ambulation/Gait Ambulation/Gait assistance: Min assist Ambulation Distance (Feet): 220 Feet Assistive device: Rolling walker (2 wheeled)       General Gait Details: slow, guarded gait performance; mod WBing bilat UEs on assist device for support.  No buckling or LOB.  Supplemental O2 (2L) to maintain sats >90%, HR elevation to 130s with exertion.   Stairs            Wheelchair Mobility    Modified Rankin (Stroke Patients Only)       Balance Overall balance assessment: Needs assistance Sitting-balance support: No upper extremity supported;Feet supported Sitting  balance-Leahy Scale: Good     Standing balance support: Bilateral upper extremity supported Standing balance-Leahy Scale: Fair                              Cognition Arousal/Alertness: Awake/alert Behavior During Therapy: WFL for tasks assessed/performed Overall Cognitive Status: Within Functional Limits for tasks assessed                                        Exercises Other Exercises Other Exercises: Sit/stand x2 with RW, min assist +1-increased time, heavy use of bilat UEs Other Exercises: Max/total assist to thread shorts over LEs, mod assist to pull over hips when standing; min assist for static standing balance    General Comments        Pertinent Vitals/Pain Pain Assessment: 0-10 Pain Score: 8  Pain Descriptors / Indicators: Aching;Grimacing;Guarding;Moaning Pain Intervention(s): Limited activity within patient's tolerance;Monitored during session;Premedicated before session;Repositioned;Patient requesting pain meds-RN notified    Home Living                      Prior Function            PT Goals (current goals can now be found in the care plan section) Acute Rehab PT Goals Patient Stated Goal: agreeable to OOB attempts with max encouragement PT Goal Formulation: With patient/family Time For Goal Achievement: 08/25/16 Potential to Achieve Goals: Good Progress towards PT  goals: Progressing toward goals    Frequency           PT Plan Current plan remains appropriate    Co-evaluation              AM-PAC PT "6 Clicks" Daily Activity  Outcome Measure  Difficulty turning over in bed (including adjusting bedclothes, sheets and blankets)?: Total Difficulty moving from lying on back to sitting on the side of the bed? : Total Difficulty sitting down on and standing up from a chair with arms (e.g., wheelchair, bedside commode, etc,.)?: Total Help needed moving to and from a bed to chair (including a wheelchair)?: A  Little Help needed walking in hospital room?: A Little Help needed climbing 3-5 steps with a railing? : A Lot 6 Click Score: 11    End of Session Equipment Utilized During Treatment: Gait belt;Oxygen Activity Tolerance: Patient limited by pain Patient left: in chair;with call bell/phone within reach;with chair alarm set;with family/visitor present Nurse Communication: Mobility status PT Visit Diagnosis: Difficulty in walking, not elsewhere classified (R26.2)     Time: 9562-1308 PT Time Calculation (min) (ACUTE ONLY): 30 min  Charges:  $Gait Training: 8-22 mins $Therapeutic Activity: 8-22 mins                    G Codes:       Ermalee Mealy H. Manson Passey, PT, DPT, NCS 08/12/16, 4:52 PM [phone]

## 2016-08-13 ENCOUNTER — Inpatient Hospital Stay: Payer: BC Managed Care – PPO

## 2016-08-13 LAB — TYPE AND SCREEN
ABO/RH(D): A POS
ANTIBODY SCREEN: NEGATIVE
UNIT DIVISION: 0
Unit division: 0
Unit division: 0

## 2016-08-13 LAB — BPAM RBC
BLOOD PRODUCT EXPIRATION DATE: 201806012359
Blood Product Expiration Date: 201805202359
Blood Product Expiration Date: 201806012359
UNIT TYPE AND RH: 6200
Unit Type and Rh: 600
Unit Type and Rh: 6200

## 2016-08-13 LAB — PREPARE RBC (CROSSMATCH)

## 2016-08-13 LAB — FUNGAL STAIN REFLEX

## 2016-08-13 MED ORDER — ALBUTEROL SULFATE (2.5 MG/3ML) 0.083% IN NEBU
2.5000 mg | INHALATION_SOLUTION | Freq: Three times a day (TID) | RESPIRATORY_TRACT | Status: DC
  Administered 2016-08-13 – 2016-08-14 (×4): 2.5 mg via RESPIRATORY_TRACT
  Filled 2016-08-13 (×5): qty 3

## 2016-08-13 NOTE — Progress Notes (Signed)
[facility] CLINIC INFECTIOUS DISEASE PROGRESS NOTE Date of Admission:  08/10/2016     ID: Jesus Trujillo is a 36 y.o. male with  empyema Active Problems:   Empyema of left pleural space (HCC)   Subjective: No fevers, walking halls,   ROS  Eleven systems are reviewed and negative except per hpi  Medications:  Antibiotics Given (last 72 hours)    Date/Time Action Medication Dose Rate   08/10/16 1742 New Bag/Given   Ampicillin-Sulbactam (UNASYN) 3 g in sodium chloride 0.9 % 100 mL IVPB 3 g 200 mL/hr   08/10/16 2134 New Bag/Given   cefUROXime (ZINACEF) 1.5 g in dextrose 5 % 50 mL IVPB 1.5 g 100 mL/hr   08/10/16 2242 New Bag/Given   Ampicillin-Sulbactam (UNASYN) 3 g in sodium chloride 0.9 % 100 mL IVPB 3 g 200 mL/hr   08/11/16 0512 New Bag/Given   Ampicillin-Sulbactam (UNASYN) 3 g in sodium chloride 0.9 % 100 mL IVPB 3 g 200 mL/hr   08/11/16 1105 New Bag/Given   Ampicillin-Sulbactam (UNASYN) 3 g in sodium chloride 0.9 % 100 mL IVPB 3 g 200 mL/hr   08/11/16 1858 New Bag/Given  [med not available from pharmacy]   Ampicillin-Sulbactam (UNASYN) 3 g in sodium chloride 0.9 % 100 mL IVPB 3 g 200 mL/hr   08/11/16 2239 New Bag/Given   Ampicillin-Sulbactam (UNASYN) 3 g in sodium chloride 0.9 % 100 mL IVPB 3 g 200 mL/hr   08/12/16 0557 New Bag/Given   Ampicillin-Sulbactam (UNASYN) 3 g in sodium chloride 0.9 % 100 mL IVPB 3 g 200 mL/hr   08/12/16 1228 New Bag/Given   Ampicillin-Sulbactam (UNASYN) 3 g in sodium chloride 0.9 % 100 mL IVPB 3 g 200 mL/hr   08/12/16 1710 New Bag/Given   Ampicillin-Sulbactam (UNASYN) 3 g in sodium chloride 0.9 % 100 mL IVPB 3 g 200 mL/hr   08/12/16 2323 New Bag/Given   Ampicillin-Sulbactam (UNASYN) 3 g in sodium chloride 0.9 % 100 mL IVPB 3 g 200 mL/hr   08/13/16 0543 New Bag/Given   Ampicillin-Sulbactam (UNASYN) 3 g in sodium chloride 0.9 % 100 mL IVPB 3 g 200 mL/hr   08/13/16 1029 New Bag/Given   Ampicillin-Sulbactam (UNASYN) 3 g in sodium chloride 0.9 % 100  mL IVPB 3 g 200 mL/hr     . albuterol  2.5 mg Nebulization Q4H while awake  . bisacodyl  10 mg Oral Daily  . enoxaparin (LOVENOX) injection  40 mg Subcutaneous Q24H  . gabapentin  200 mg Oral TID  . traMADol  100 mg Oral Q6H    Objective: Vital signs in last 24 hours: Temp:  [97.5 F (36.4 C)-99.5 F (37.5 C)] 97.5 F (36.4 C) (05/17 0549) Pulse Rate:  [86-110] 86 (05/17 0549) Resp:  [20-24] 20 (05/17 0549) BP: (114-140)/(61-81) 114/61 (05/17 0549) SpO2:  [86 %-99 %] 92 % (05/17 0747)   Lab Results  Recent Labs  08/11/16 1307 08/12/16 0422  WBC 13.9* 15.4*  HGB 12.7* 12.1*  HCT 38.9* 35.8*  NA 136 133*  K 3.9 3.6  CL 102 97*  CO2 25 28  BUN 9 10  CREATININE 0.76 0.74    Microbiology: Results for orders placed or performed during the hospital encounter of 08/10/16  Aerobic/Anaerobic Culture (surgical/deep wound)     Status: None (Preliminary result)   Collection Time: 08/10/16 10:20 AM  Result Value Ref Range Status   Specimen Description TISSUE EMPYEMA LEFT LUNG  Final   Special Requests SWAB SENT  Final  Gram Stain   Final    RARE WBC PRESENT,BOTH PMN AND MONONUCLEAR NO ORGANISMS SEEN    Culture   Final    NO GROWTH 2 DAYS NO ANAEROBES ISOLATED; CULTURE IN PROGRESS FOR 5 DAYS Performed at [facility], [address] [place], [place] [address]    Report Status PENDING  Incomplete  Acid Fast Smear (AFB)     Status: None (Preliminary result)   Collection Time: 08/10/16 10:23 AM  Result Value Ref Range Status   AFB Specimen Processing Concentration  Final   Acid Fast Smear Negative  Final    Comment: (NOTE) Performed At: [facility] [address] [place], [place] [id number] Mila Homer MD [id number]    Source (AFB) PENDING  Incomplete  Fungus Stain     Status: None (Preliminary result)   Collection Time: 08/10/16 10:23 AM  Result Value Ref Range Status   FUNGUS STAIN Final report  Final    Comment: (NOTE) Performed At: [facility] [address] [place], [place] [id number] Mila Homer MD [id number]    Fungal Source PENDING  Incomplete  Fungal Stain reflex     Status: None   Collection Time: 08/10/16 10:23 AM  Result Value Ref Range Status   Fungal stain result 1 Comment  Final    Comment: (NOTE) KOH/Calcofluor preparation:  no fungus observed. Performed At: [facility] [address] [place], [place] [id number] Mila Homer MD [id number]   MRSA PCR Screening     Status: None   Collection Time: 08/10/16  2:30 PM  Result Value Ref Range Status   MRSA by PCR NEGATIVE NEGATIVE Final    Comment:        The GeneXpert MRSA Assay (FDA approved for NASAL specimens only), is one component of a comprehensive MRSA colonization surveillance program. It is not intended to diagnose MRSA infection nor to guide or monitor treatment for MRSA infections.   CULTURE, BLOOD (ROUTINE X 2) w Reflex to ID Panel     Status: None (Preliminary result)   Collection Time: 08/11/16  1:07 PM  Result Value Ref Range Status   Specimen Description BLOOD L HAND  Final   Special Requests   Final    BOTTLES DRAWN AEROBIC AND ANAEROBIC Blood Culture adequate volume   Culture NO GROWTH 2 DAYS  Final   Report Status PENDING  Incomplete  CULTURE, BLOOD (ROUTINE X 2) w Reflex to ID Panel     Status: None (Preliminary result)   Collection Time: 08/11/16  1:14 PM  Result Value Ref Range Status   Specimen Description BLOOD R AC  Final   Special Requests   Final    BOTTLES DRAWN AEROBIC AND ANAEROBIC Blood Culture results may not be optimal due to an excessive volume of blood received in culture bottles   Culture NO GROWTH 2 DAYS  Final   Report Status PENDING  Incomplete    Studies/Results: Dg Chest 2 View  Result Date: 08/13/2016 CLINICAL DATA:  Postop check.  History of pleurisy. EXAM: CHEST  2 VIEW COMPARISON:  08/11/2016 FINDINGS: There is a loculated left pleural effusion. There is a  left-sided chest tube directed towards the apex. There is left upper and lower lobe airspace disease most concerning for atelectasis versus pneumonia. There is no pneumothorax. The right lung is clear. The heart and mediastinum are stable. The osseous structures are unremarkable. IMPRESSION: 1. Left-sided chest tube in unchanged position without a pneumothorax. 2. There is  a loculated left pleural effusion. There is left upper and lower lobe airspace disease most concerning for atelectasis versus pneumonia. Electronically Signed   By: Elige KoHetal  Patel   On: 08/13/2016 09:29    Assessment/Plan: Jesus Trujillo is a 36 y.o. male with Empyema following episode of CAP now s/p decortication 5/14. Prior bcx and pleural fluid cx neg but no sputum cx done. Leg and strep PNA urine ags were negative at initial admission. HIV neg. MRSA PCR. Cx negative to date Recommendations I would rec continue unasyn at this point. Will likely need prolonged abx but if cultures negative for resistant pathogens can likely dc on prolonged oral augmentin.  CXR shows continued loculation- may need further intervention Plan would be to treat until compelte radiological resolution.  Thank you very much for the consult. Will follow with you.  Lawrnce Reyez, Parish P   08/13/2016, 12:09 PM

## 2016-08-13 NOTE — Progress Notes (Signed)
Physical Therapy Treatment Patient Details Name: Jesus NiemannDavid A Callicott MRN: [id number] DOB: 1980/04/24 Today's Date: 08/13/2016    History of Present Illness admitted for acute hospitalization status post VATS procedure with thoracotomy and decortication of L lung secondary to empyema.    PT Comments    Marked improvement in functional performance this date, completing gait around nursing station without assist device, sup level of assist only.  Maintains sats >89% on RA throughout session; much improved comfort and confidence with all mobility efforts. Did encourage gait one additional time around nursing station this PM with staff/family as appropriate; patient/wife/RN voiced understanding. Additional activity deferred this date, as CNA planning for ADL.   Follow Up Recommendations  Home health PT     Equipment Recommendations       Recommendations for Other Services       Precautions / Restrictions Precautions Precautions: Fall Precaution Comments: L chest tube x3 Restrictions Weight Bearing Restrictions: No    Mobility  Bed Mobility               General bed mobility comments: seated in recliner beginning/end of session  Transfers Overall transfer level: Needs assistance Equipment used: None Transfers: Sit to/from Stand Sit to Stand: Supervision            Ambulation/Gait Ambulation/Gait assistance: Supervision Ambulation Distance (Feet): 220 Feet Assistive device: None       General Gait Details: improving fluidity and confidence with gait performance; able to complete without assist device this date, maintaining sats >89% on RA   Stairs            Wheelchair Mobility    Modified Rankin (Stroke Patients Only)       Balance                                            Cognition Arousal/Alertness: Awake/alert Behavior During Therapy: WFL for tasks assessed/performed Overall Cognitive Status: Within Functional Limits  for tasks assessed                                        Exercises      General Comments        Pertinent Vitals/Pain Pain Assessment: 0-10 Pain Score: 6  Pain Location:  L flank Pain Descriptors / Indicators: Aching;Grimacing;Guarding;Moaning Pain Intervention(s): Limited activity within patient's tolerance;Monitored during session;Repositioned;Patient requesting pain meds-RN notified;RN gave pain meds during session    Home Living                      Prior Function            PT Goals (current goals can now be found in the care plan section) Acute Rehab PT Goals Patient Stated Goal: agreeable to OOB attempts with max encouragement PT Goal Formulation: With patient/family Time For Goal Achievement: 08/25/16 Potential to Achieve Goals: Good Progress towards PT goals: Progressing toward goals    Frequency    Min 2X/week      PT Plan Current plan remains appropriate    Co-evaluation              AM-PAC PT "6 Clicks" Daily Activity  Outcome Measure  Difficulty turning over in bed (including adjusting bedclothes, sheets and blankets)?: A Lot Difficulty moving from lying  on back to sitting on the side of the bed? : A Lot Difficulty sitting down on and standing up from a chair with arms (e.g., wheelchair, bedside commode, etc,.)?: A Little Help needed moving to and from a bed to chair (including a wheelchair)?: A Little Help needed walking in hospital room?: A Little Help needed climbing 3-5 steps with a railing? : A Little 6 Click Score: 16    End of Session   Activity Tolerance: Patient tolerated treatment well Patient left: in chair;with call bell/phone within reach;with family/visitor present Nurse Communication: Mobility status (patient/family/RN encouraged for gait one additional time around nursing station this PM) PT Visit Diagnosis: Difficulty in walking, not elsewhere classified (R26.2)     Time: 1610-9604 PT Time  Calculation (min) (ACUTE ONLY): 11 min  Charges:  $Gait Training: 8-22 mins                    G Codes:       Chijioke Lasser H. Manson Passey, PT, DPT, NCS 08/13/16, 10:00 PM [phone]

## 2016-08-13 NOTE — Progress Notes (Signed)
He states that his pain has been under better control. He has a better appetite. He did have some difficulty in passing his urine overnight but after he stood up this morning he was able to go without difficulties.  I walked with the patient around the nurse's station. His oxygen saturations were 92% after that. His lungs sounded better today with more aeration on the left side. His heart was regular. His wounds were clean dry and intact.  He was able to pass 900 cc urine this morning. We will continue to monitor that.  We will encourage him to ambulate as much as possible. We will stop his oxygen therapy at this time. We'll discontinue his intravenous fluids as he appears to be eating quite well.

## 2016-08-14 ENCOUNTER — Inpatient Hospital Stay: Payer: BC Managed Care – PPO

## 2016-08-14 NOTE — Progress Notes (Signed)
Physical Therapy Treatment Patient Details Name: Jesus NiemannDavid A Trujillo MRN: [id number] DOB: 1981/01/29 Today's Date: 08/14/2016    History of Present Illness Pt admitted for acute hospitalization status post VATS procedure with thoracotomy and decortication of L lung secondary to empyema.    PT Comments    Pt ambulated around nursing loop without AD and O2 sats 91% or greater on room air.  Pt navigated stairs with railing SBA and pt's O2 sats decreased to 88% but improved to >95% within a minute on room air (vc's for pursed lip breathing given).  Pt continues to make progress but requires some encouragement to ambulate multiple times during the day.  Pt able to manage chest tubes independently during functional mobility.  Will focus on higher level balance with functional mobility and keeping O2 sats >90% with functional mobility during hospital stay.  No PT needs anticipated upon hospital discharge.   Follow Up Recommendations  No PT follow up     Equipment Recommendations  None recommended by PT    Recommendations for Other Services       Precautions / Restrictions Precautions Precautions: Fall Precaution Comments: L chest tube x3 Restrictions Weight Bearing Restrictions: No    Mobility  Bed Mobility               General bed mobility comments: Deferred d/t pt sitting in recliner beginning and end of session.  Transfers Overall transfer level: Modified independent Equipment used: None Transfers: Sit to/from UGI CorporationStand;Stand Pivot Transfers Sit to Stand: Modified independent (Device/Increase time) Stand pivot transfers: Modified independent (Device/Increase time)       General transfer comment: steady strong transfers without loss of balance  Ambulation/Gait Ambulation/Gait assistance: Supervision Ambulation Distance (Feet):  (240 feet; 200 feet) Assistive device: None Gait Pattern/deviations: Step-through pattern     General Gait Details: steady without loss of  balance   Stairs Stairs: Yes   Stair Management: One rail Right;Forwards Number of Stairs: 6 General stair comments: step to descending; step through ascending  Wheelchair Mobility    Modified Rankin (Stroke Patients Only)       Balance Overall balance assessment: Needs assistance Sitting-balance support: No upper extremity supported;Feet supported Sitting balance-Leahy Scale: Normal     Standing balance support: No upper extremity supported;During functional activity Standing balance-Leahy Scale: Good Standing balance comment: with ambulation               High Level Balance Comments: SBA ambulation with head turns R/L/up/down and increasing/decreasing speed            Cognition Arousal/Alertness: Awake/alert Behavior During Therapy: WFL for tasks assessed/performed Overall Cognitive Status: Within Functional Limits for tasks assessed                                        Exercises      General Comments   Nursing cleared pt for participation in physical therapy.  Pt agreeable to PT session.      Pertinent Vitals/Pain Pain Assessment: 0-10 Pain Score: 3  Pain Location:  L flank Pain Descriptors / Indicators: Sore Pain Intervention(s): Limited activity within patient's tolerance;Monitored during session;Repositioned    Home Living                      Prior Function            PT Goals (current goals can now be found  in the care plan section) Acute Rehab PT Goals Patient Stated Goal: to go home PT Goal Formulation: With patient Time For Goal Achievement: 08/25/16 Potential to Achieve Goals: Good Progress towards PT goals: Progressing toward goals    Frequency    Min 2X/week      PT Plan Current plan remains appropriate    Co-evaluation              AM-PAC PT "6 Clicks" Daily Activity  Outcome Measure  Difficulty turning over in bed (including adjusting bedclothes, sheets and blankets)?: A  Little Difficulty moving from lying on back to sitting on the side of the bed? : A Little Difficulty sitting down on and standing up from a chair with arms (e.g., wheelchair, bedside commode, etc,.)?: None Help needed moving to and from a bed to chair (including a wheelchair)?: None Help needed walking in hospital room?: A Little Help needed climbing 3-5 steps with a railing? : A Little 6 Click Score: 20    End of Session Equipment Utilized During Treatment: Other (comment) (chest tubes) Activity Tolerance: Patient tolerated treatment well Patient left: in chair;with call bell/phone within reach Nurse Communication: Mobility status (pt's O2 sats during session; also pt's pain during session) PT Visit Diagnosis: Difficulty in walking, not elsewhere classified (R26.2)     Time: 6295-2841 PT Time Calculation (min) (ACUTE ONLY): 12 min  Charges:  $Therapeutic Activity: 8-22 mins                    G CodesHendricks Limes, PT 08/14/16, 4:02 PM [phone]

## 2016-08-14 NOTE — Progress Notes (Signed)
He has made steady progress. He was able to walk several times just today without any significant limitations. He's had minimal discomfort today. He was able to urinate without difficulties. His appetite is good and he had a bowel movement.  His chest tube was put out minimal amounts of serosanguineous fluid. There is no air leak. His lungs are actually better aerated today. There is slight decrease at the left base. His wounds are clean dry and intact. I did cleanse them with Hibiclens today and change the dressings. We did place the patient to waterseal and got another chest x-ray. This shows no evidence of pneumothorax.  We will leave his chest tubes to waterseal. He will have his chest tube placed to an empty atrium on Monday in preparation for discharge. His wife will be instructed on wound care and he will continue to make steady progress over the next few days.

## 2016-08-15 LAB — CBC
HEMATOCRIT: 32.1 % — AB (ref 40.0–52.0)
Hemoglobin: 11 g/dL — ABNORMAL LOW (ref 13.0–18.0)
MCH: 28.6 pg (ref 26.0–34.0)
MCHC: 34.1 g/dL (ref 32.0–36.0)
MCV: 83.9 fL (ref 80.0–100.0)
PLATELETS: 355 10*3/uL (ref 150–440)
RBC: 3.83 MIL/uL — AB (ref 4.40–5.90)
RDW: 13.6 % (ref 11.5–14.5)
WBC: 8.8 10*3/uL (ref 3.8–10.6)

## 2016-08-15 LAB — COMPREHENSIVE METABOLIC PANEL
ALT: 41 U/L (ref 17–63)
AST: 33 U/L (ref 15–41)
Albumin: 2.5 g/dL — ABNORMAL LOW (ref 3.5–5.0)
Alkaline Phosphatase: 85 U/L (ref 38–126)
Anion gap: 8 (ref 5–15)
BILIRUBIN TOTAL: 0.3 mg/dL (ref 0.3–1.2)
BUN: 12 mg/dL (ref 6–20)
CO2: 30 mmol/L (ref 22–32)
CREATININE: 0.74 mg/dL (ref 0.61–1.24)
Calcium: 8.5 mg/dL — ABNORMAL LOW (ref 8.9–10.3)
Chloride: 98 mmol/L — ABNORMAL LOW (ref 101–111)
GFR calc Af Amer: >60 mL/min (ref >60)
Glucose, Bld: 102 mg/dL — ABNORMAL HIGH (ref 65–99)
Potassium: 4.1 mmol/L (ref 3.5–5.1)
Sodium: 136 mmol/L (ref 135–145)
Total Protein: 7.1 g/dL (ref 6.5–8.1)

## 2016-08-15 LAB — AEROBIC/ANAEROBIC CULTURE (SURGICAL/DEEP WOUND)

## 2016-08-15 LAB — QUANTIFERON IN TUBE
QFT TB AG MINUS NIL VALUE: 0 IU/mL
QUANTIFERON MITOGEN VALUE: 2.44 [IU]/mL
QUANTIFERON TB AG VALUE: 0.03 IU/mL
QUANTIFERON TB GOLD: NEGATIVE
Quantiferon Nil Value: 0.03 IU/mL

## 2016-08-15 LAB — AEROBIC/ANAEROBIC CULTURE W GRAM STAIN (SURGICAL/DEEP WOUND): Culture: NO GROWTH

## 2016-08-15 LAB — QUANTIFERON TB GOLD ASSAY (BLOOD)

## 2016-08-15 MED ORDER — ALBUTEROL SULFATE (2.5 MG/3ML) 0.083% IN NEBU: 2.5000 mg | INHALATION_SOLUTION | Freq: Four times a day (QID) | RESPIRATORY_TRACT | Status: DC | PRN

## 2016-08-15 NOTE — Progress Notes (Signed)
5 Days Post-Op  Subjective: This patient with minimal pain tolerating regular diet no shortness of breath  Objective: Vital signs in last 24 hours: Temp:  [98.1 F (36.7 C)-98.7 F (37.1 C)] 98.2 F (36.8 C) (05/19 0424) Pulse Rate:  [86-97] 97 (05/19 0424) Resp:  [18-20] 18 (05/19 0424) BP: (115-128)/(59-67) 115/67 (05/19 0424) SpO2:  [94 %-99 %] 98 % (05/19 0424) Last BM Date: 08/13/16  Intake/Output from previous day: 05/18 0701 - 05/19 0700 In: 780 [P.O.:480; IV Piggyback:300] Out: 675 [Urine:625; Chest Tube:50] Intake/Output this shift: Total I/O In: 240 [P.O.:240] Out: -   Physical exam:  No air leak. Clear breath sounds bilaterally. Multiple chest tubes on the left.  Lab Results: CBC   Recent Labs  08/15/16 0447  WBC 8.8  HGB 11.0*  HCT 32.1*  PLT 355   BMET  Recent Labs  08/15/16 0447  NA 136  K 4.1  CL 98*  CO2 30  GLUCOSE 102*  BUN 12  CREATININE 0.74  CALCIUM 8.5*   PT/INR No results for input(s): LABPROT, INR in the last 72 hours. ABG No results for input(s): PHART, HCO3 in the last 72 hours.  Invalid input(s): PCO2, PO2  Studies/Results: Dg Chest 2 View  Result Date: 08/14/2016 CLINICAL DATA:  Evaluate chest tube. EXAM: CHEST  2 VIEW COMPARISON:  08/13/2016 FINDINGS: There are 3 left-sided chest tubes. No significant change in position. No pneumothorax identified. Loculated left pleural effusion is identified. Left midlung and left base airspace opacities are identified. Right lung is clear. IMPRESSION: 1. No change in position of left chest tubes. 2. Loculated left pleural effusion with diminished aeration to the left lung is unchanged. . Electronically Signed   By: Signa Kellaylor  Stroud M.D.   On: 08/14/2016 13:31    Anti-infectives: Anti-infectives    Start     Dose/Rate Route Frequency Ordered Stop   08/10/16 2100  cefUROXime (ZINACEF) 1.5 g in dextrose 5 % 50 mL IVPB  Status:  Discontinued     1.5 g 100 mL/hr over 30 Minutes  Intravenous Every 12 hours 08/10/16 1336 08/11/16 0736   08/10/16 1700  Ampicillin-Sulbactam (UNASYN) 3 g in sodium chloride 0.9 % 100 mL IVPB     3 g 200 mL/hr over 30 Minutes Intravenous Every 6 hours 08/10/16 1659     08/10/16 0800  cefUROXime (ZINACEF) 1.5 g in dextrose 5 % 50 mL IVPB     1.5 g 100 mL/hr over 30 Minutes Intravenous 60 min pre-op 08/10/16 [id number] 08/10/16 1922      Assessment/Plan: s/p Procedure(s): PREOP BRONCHOSCOPY VIDEO ASSISTED THORACOSCOPY THORACOTOMY MAJOR WITH DECORTICATION   Labs reviewed. Patient doing very well continue current therapy including IV antibiotics. Probably home on Monday.  Lattie Hawichard E Anterio Scheel, MD, FACS  08/15/2016

## 2016-08-16 LAB — CULTURE, BLOOD (ROUTINE X 2)
CULTURE: NO GROWTH
CULTURE: NO GROWTH
Special Requests: ADEQUATE

## 2016-08-16 NOTE — Progress Notes (Signed)
6 Days Post-Op  Subjective: Status post left thoracotomy for empyema. He has no complaints his pain is well managed.  Objective: Vital signs in last 24 hours: Temp:  [98 F (36.7 C)-98.7 F (37.1 C)] 98.2 F (36.8 C) (05/20 0427) Pulse Rate:  [89-93] 89 (05/20 0427) Resp:  [18-20] 18 (05/20 0427) BP: (99-120)/(55-59) 112/59 (05/20 0427) SpO2:  [97 %-99 %] 97 % (05/20 0427) Last BM Date: 08/15/16  Intake/Output from previous day: 05/19 0701 - 05/20 0700 In: 820 [P.O.:720; IV Piggyback:100] Out: 1270 [Urine:1200; Chest Tube:70] Intake/Output this shift: Total I/O In: 240 [P.O.:240] Out: -   Physical exam:  Chest clear to auscultation bilaterally no wheezes good breath sounds bilaterally chest tube is in place without air leak  Lab Results: CBC   Recent Labs  08/15/16 0447  WBC 8.8  HGB 11.0*  HCT 32.1*  PLT 355   BMET  Recent Labs  08/15/16 0447  NA 136  K 4.1  CL 98*  CO2 30  GLUCOSE 102*  BUN 12  CREATININE 0.74  CALCIUM 8.5*   PT/INR No results for input(s): LABPROT, INR in the last 72 hours. ABG No results for input(s): PHART, HCO3 in the last 72 hours.  Invalid input(s): PCO2, PO2  Studies/Results: Dg Chest 2 View  Result Date: 08/14/2016 CLINICAL DATA:  Evaluate chest tube. EXAM: CHEST  2 VIEW COMPARISON:  08/13/2016 FINDINGS: There are 3 left-sided chest tubes. No significant change in position. No pneumothorax identified. Loculated left pleural effusion is identified. Left midlung and left base airspace opacities are identified. Right lung is clear. IMPRESSION: 1. No change in position of left chest tubes. 2. Loculated left pleural effusion with diminished aeration to the left lung is unchanged. . Electronically Signed   By: Signa Kellaylor  Stroud M.D.   On: 08/14/2016 13:31    Anti-infectives: Anti-infectives    Start     Dose/Rate Route Frequency Ordered Stop   08/10/16 2100  cefUROXime (ZINACEF) 1.5 g in dextrose 5 % 50 mL IVPB  Status:   Discontinued     1.5 g 100 mL/hr over 30 Minutes Intravenous Every 12 hours 08/10/16 1336 08/11/16 0736   08/10/16 1700  Ampicillin-Sulbactam (UNASYN) 3 g in sodium chloride 0.9 % 100 mL IVPB     3 g 200 mL/hr over 30 Minutes Intravenous Every 6 hours 08/10/16 1659     08/10/16 0800  cefUROXime (ZINACEF) 1.5 g in dextrose 5 % 50 mL IVPB     1.5 g 100 mL/hr over 30 Minutes Intravenous 60 min pre-op 08/10/16 0722 08/10/16 1922      Assessment/Plan: s/p Procedure(s): PREOP BRONCHOSCOPY VIDEO ASSISTED THORACOSCOPY THORACOTOMY MAJOR WITH DECORTICATION   Patient doing very well chest x-ray is ordered for tomorrow planned for conversion to empyema tubes for drainage tomorrow and likely home. These plans will be verified by Dr. Thelma Bargeaks in the morning  Lattie Hawichard E Laurella Tull, MD, [facility]  08/16/2016

## 2016-08-17 ENCOUNTER — Inpatient Hospital Stay: Payer: BC Managed Care – PPO

## 2016-08-17 ENCOUNTER — Telehealth: Payer: Self-pay | Admitting: Pulmonary Disease

## 2016-08-17 MED ORDER — AMOXICILLIN 500 MG PO CAPS: 500.0000 mg | ORAL_CAPSULE | Freq: Every day | ORAL | Status: DC

## 2016-08-17 MED ORDER — BISACODYL 5 MG PO TBEC: 10.0000 mg | DELAYED_RELEASE_TABLET | Freq: Every day | ORAL | 0 refills | Status: DC

## 2016-08-17 MED ORDER — GABAPENTIN 100 MG PO CAPS: 200.0000 mg | ORAL_CAPSULE | Freq: Three times a day (TID) | ORAL | 0 refills | Status: DC

## 2016-08-17 MED ORDER — TRAMADOL HCL 50 MG PO TABS: 100.0000 mg | ORAL_TABLET | Freq: Four times a day (QID) | ORAL | 0 refills | Status: DC

## 2016-08-17 MED ORDER — AMOXICILLIN-POT CLAVULANATE 875-125 MG PO TABS: 1.0000 | ORAL_TABLET | Freq: Two times a day (BID) | ORAL | 0 refills | Status: DC

## 2016-08-17 MED ORDER — ALBUTEROL SULFATE (2.5 MG/3ML) 0.083% IN NEBU: 2.5000 mg | INHALATION_SOLUTION | Freq: Four times a day (QID) | RESPIRATORY_TRACT | 12 refills | Status: DC | PRN

## 2016-08-17 MED ORDER — OXYCODONE HCL 15 MG PO TABS: 15.0000 mg | ORAL_TABLET | ORAL | 0 refills | Status: DC | PRN

## 2016-08-17 MED ORDER — AMOXICILLIN-POT CLAVULANATE 875-125 MG PO TABS: 1.0000 | ORAL_TABLET | Freq: Two times a day (BID) | ORAL | Status: DC

## 2016-08-17 MED ORDER — AMOXICILLIN 500 MG PO CAPS: 500.0000 mg | ORAL_CAPSULE | Freq: Every day | ORAL | 0 refills | Status: DC

## 2016-08-17 NOTE — Final Progress Note (Signed)
Chest tubes converted to empyema tubes.  CXRay looked good.  Ready for discharge.  Will see in the office on Friday of this week.  [employer]on Energyim Ludie Pavlik.

## 2016-08-17 NOTE — Telephone Encounter (Signed)
Pt wife is calling, asking if pt needs to keep appt for 5/25. She states he had a thoracotomy on 5/14, and is scheduled for f/u with Dr. Thelma Bargeaks on 5/25. Please call and advise.

## 2016-08-17 NOTE — Progress Notes (Signed)
IV was removed. Discharge instructions, follow-up appointments, and prescriptions were provided to the pt and family at bedside. The pt was taken downstairs via wheelchair by NT.

## 2016-08-17 NOTE — Progress Notes (Signed)
  Patient ID: Jesus NiemannDavid A Trujillo, male   DOB: 1981/03/28, 36 y.o.   MRN: [id number]  HISTORY: He did well overnight. He has good pain management on his current regimen. He denied any shortness of breath or fevers.   Vitals:   08/16/16 2002 08/17/16 0609  BP: 117/63 109/60  Pulse: 86 80  Resp: 20 17  Temp: 97.9 F (36.6 C) 97.9 F (36.6 C)     EXAM:    Resp: Lungs are clear bilaterally But decreased at both bases..  No respiratory distress, normal effort. Heart:  Regular without murmurs Abd:  Abdomen is soft, non distended and non tender. No masses are palpable.  There is no rebound and no guarding.  Neurological: Alert and oriented to person, place, and time. Coordination normal.  Skin: Skin is warm and dry. No rash noted. No diaphoretic. No erythema. No pallor.  Psychiatric: Normal mood and affect. Normal behavior. Judgment and thought content normal.   His thoracotomy wound is clean dry and intact. There is no erythema.  The chest tube dressings is clean dry and intact as well. There is no air leak.  ASSESSMENT: We will obtain a chest x-ray now. If his chest x-ray looks good we'll convert him to empyema tubes. His wife is present as bedside be instructed on how to manage his chest tubes as an outpatient. They've requested home health services.   PLAN:   We will obtain a chest x-ray now. I will convert his tubes to empyema tubes of his chest x-ray is stable.    Hulda Marinimothy Arieliz Latino, MD

## 2016-08-17 NOTE — Progress Notes (Signed)
ANTIBIOTIC CONSULT NOTE - INITIAL  Pharmacy Consult for Unasyn Indication: empyema  No Known Allergies  Patient Measurements: Height: 5\' 11"  (180.3 cm) Weight: 258 lb (117 kg) IBW/kg (Calculated) : 75.3 Adjusted Body Weight:   Vital Signs: Temp: 97.9 F (36.6 C) (05/21 0609) Temp Source: Oral (05/21 0609) BP: 109/60 (05/21 0609) Pulse Rate: 80 (05/21 0609) Intake/Output from previous day: 05/20 0701 - 05/21 0700 In: 240 [P.O.:240] Out: 3295 [Urine:3250; Chest Tube:45] Intake/Output from this shift: Total I/O In: 240 [P.O.:240] Out: 850 [Urine:850]  Labs:  Recent Labs  08/15/16 0447  WBC 8.8  HGB 11.0*  PLT 355  CREATININE 0.74   Estimated Creatinine Clearance: 170.9 mL/min (by C-G formula based on SCr of 0.74 mg/dL). No results for input(s): VANCOTROUGH, VANCOPEAK, VANCORANDOM, GENTTROUGH, GENTPEAK, GENTRANDOM, TOBRATROUGH, TOBRAPEAK, TOBRARND, AMIKACINPEAK, AMIKACINTROU, AMIKACIN in the last 72 hours.   Microbiology: Recent Results (from the past 720 hour(s))  Surgical pcr screen     Status: None   Collection Time: 08/07/16  1:48 PM  Result Value Ref Range Status   MRSA, PCR NEGATIVE NEGATIVE Final   Staphylococcus aureus NEGATIVE NEGATIVE Final    Comment:        The Xpert SA Assay (FDA approved for NASAL specimens in patients over 34 years of age), is one component of a comprehensive surveillance program.  Test performance has been validated by [facility] for patients greater than or equal to 604 year old. It is not intended to diagnose infection nor to guide or monitor treatment.   Aerobic/Anaerobic Culture (surgical/deep wound)     Status: None   Collection Time: 08/10/16 10:20 AM  Result Value Ref Range Status   Specimen Description TISSUE EMPYEMA LEFT LUNG  Final   Special Requests SWAB SENT  Final   Gram Stain   Final    RARE WBC PRESENT,BOTH PMN AND MONONUCLEAR NO ORGANISMS SEEN    Culture   Final    No growth aerobically or  anaerobically. Performed at [facility], [address] [place], [place] [address]    Report Status 08/15/2016 FINAL  Final  Acid Fast Smear (AFB)     Status: None (Preliminary result)   Collection Time: 08/10/16 10:23 AM  Result Value Ref Range Status   AFB Specimen Processing Concentration  Final   Acid Fast Smear Negative  Final    Comment: (NOTE) Performed At: [facility] [place] [address] [place], [place] [id number] Mila HomerHancock William F MD XB:[id number]:[phone]    Source (AFB) PENDING  Incomplete  Fungus Stain     Status: None (Preliminary result)   Collection Time: 08/10/16 10:23 AM  Result Value Ref Range Status   FUNGUS STAIN Final report  Final    Comment: (NOTE) Performed At: [facility] [place] [address] [place], [place] [id number] Mila HomerHancock William F MD [id number]:[phone]    Fungal Source PENDING  Incomplete  Fungal Stain reflex     Status: None   Collection Time: 08/10/16 10:23 AM  Result Value Ref Range Status   Fungal stain result 1 Comment  Final    Comment: (NOTE) KOH/Calcofluor preparation:  no fungus observed. Performed At: [facility] [place] [address] [place], [place] [id number] Mila HomerHancock William F MD [id number]:[phone]   MRSA PCR Screening     Status: None   Collection Time: 08/10/16  2:30 PM  Result Value Ref Range Status   MRSA by PCR NEGATIVE NEGATIVE Final    Comment:        The GeneXpert MRSA  Assay (FDA approved for NASAL specimens only), is one component of a comprehensive MRSA colonization surveillance program. It is not intended to diagnose MRSA infection nor to guide or monitor treatment for MRSA infections.   CULTURE, BLOOD (ROUTINE X 2) w Reflex to ID Panel     Status: None   Collection Time: 08/11/16  1:07 PM  Result Value Ref Range Status   Specimen Description BLOOD L HAND  Final   Special Requests   Final    BOTTLES DRAWN AEROBIC AND ANAEROBIC Blood Culture adequate volume   Culture NO GROWTH 5 DAYS  Final   Report Status  08/16/2016 FINAL  Final  CULTURE, BLOOD (ROUTINE X 2) w Reflex to ID Panel     Status: None   Collection Time: 08/11/16  1:14 PM  Result Value Ref Range Status   Specimen Description BLOOD R AC  Final   Special Requests   Final    BOTTLES DRAWN AEROBIC AND ANAEROBIC Blood Culture results may not be optimal due to an excessive volume of blood received in culture bottles   Culture NO GROWTH 5 DAYS  Final   Report Status 08/16/2016 FINAL  Final    Medical History: Past Medical History:  Diagnosis Date  . Anxiety   . History of pleurisy 06/2016  . Multifocal pneumonia 06/2016  . SIRS (systemic inflammatory response syndrome) (HCC) 06/2016    Medications:  Prescriptions Prior to Admission  Medication Sig Dispense Refill Last Dose  . diphenhydrAMINE (BENADRYL) 25 MG tablet Take 25 mg by mouth at bedtime as needed for sleep.   Past Week at Unknown time  . ibuprofen (ADVIL,MOTRIN) 200 MG tablet Take 400-600 mg by mouth daily as needed for headache or moderate pain.   Past Week at Unknown time  . Ipratropium-Albuterol (COMBIVENT RESPIMAT) 20-100 MCG/ACT AERS respimat Inhale 1 puff into the lungs every 6 (six) hours as needed for wheezing. 1 Inhaler 0 08/10/2016 at 0500  . Melatonin 5 MG TABS Take 5 mg by mouth at bedtime as needed (sleep).   Past Month at Unknown time   Scheduled:  . bisacodyl  10 mg Oral Daily  . enoxaparin (LOVENOX) injection  40 mg Subcutaneous Q24H  . gabapentin  200 mg Oral TID  . traMADol  100 mg Oral Q6H   Assessment: Pharmacy consulted to dose and monitor Unasyn in this 36 year old male being treated for empyema with Unasyn.  Plan:  Will continue Unasyn 3 g IV q6 hours.   Clovia Cuff, PharmD, BCPS 08/17/2016 2:00 PM

## 2016-08-17 NOTE — Discharge Summary (Signed)
Physician Discharge Summary  Patient ID: Jesus NiemannDavid A Trujillo MRN: [id number] DOB/AGE: 1980/05/13 36 y.o.  Admit date: 08/10/2016 Discharge date: 08/17/2016   Discharge Diagnoses:  Active Problems:   Empyema of left pleural space (HCC)   Procedures:  Left thoracotomy and decortication of lung.  Hospital Course: Admitted for left thoractomy and decortication of lung.  Did well postop without any problems.  Chest tubes converted to "empyema tubes" on POD #7 and no pneumothorax seen with chest tubes to open drainage.  Instructed wife on wound care.    Disposition: 01-Home or Self Care  Discharge Instructions    Diet - low sodium heart healthy    Complete by:  As directed    Discharge instructions    Complete by:  As directed    Cleanse around chest tube sites daily with Hibiclens or other skin cleanser and cover with 4 x 4 gauze.  Call for any fever, chills, cough, incision drainage or redness.  Contact [facility] for an appointment on Friday.   Increase activity slowly    Complete by:  As directed      Allergies as of 08/17/2016   No Known Allergies     Medication List    STOP taking these medications   diphenhydrAMINE 25 MG tablet Commonly known as:  BENADRYL   ibuprofen 200 MG tablet Commonly known as:  ADVIL,MOTRIN   Melatonin 5 MG Tabs     TAKE these medications   albuterol (2.5 MG/3ML) 0.083% nebulizer solution Commonly known as:  PROVENTIL Take 3 mLs (2.5 mg total) by nebulization every 6 (six) hours as needed for wheezing or shortness of breath.   amoxicillin 500 MG capsule Commonly known as:  AMOXIL Take 1 capsule (500 mg total) by mouth daily with lunch.   amoxicillin-clavulanate 875-125 MG tablet Commonly known as:  AUGMENTIN Take 1 tablet by mouth every 12 (twelve) hours.   bisacodyl 5 MG EC tablet Commonly known as:  DULCOLAX Take 2 tablets (10 mg total) by mouth daily. Start taking on:  08/18/2016   gabapentin 100 MG  capsule Commonly known as:  NEURONTIN Take 2 capsules (200 mg total) by mouth 3 (three) times daily.   Ipratropium-Albuterol 20-100 MCG/ACT Aers respimat Commonly known as:  COMBIVENT RESPIMAT Inhale 1 puff into the lungs every 6 (six) hours as needed for wheezing.   oxyCODONE 15 MG immediate release tablet Commonly known as:  ROXICODONE Take 1 tablet (15 mg total) by mouth every 4 (four) hours as needed for severe pain.   traMADol 50 MG tablet Commonly known as:  ULTRAM Take 2 tablets (100 mg total) by mouth every 6 (six) hours.        Hulda Marinimothy Kamarian Sahakian, MD

## 2016-08-17 NOTE — Progress Notes (Signed)
[facility] INFECTIOUS DISEASE PROGRESS NOTE Date of Admission:  08/10/2016     ID: Jesus Trujillo is a 36 y.o. male with  empyema Active Problems:   Empyema of left pleural space (HCC)   Subjective: He has no more fevers, he has some pressure at the CT site but no pleuritic pain  ROS  Eleven systems are reviewed and negative except per hpi  Medications:  Antibiotics Given (last 72 hours)    Date/Time Action Medication Dose Rate   08/14/16 1642 New Bag/Given   Ampicillin-Sulbactam (UNASYN) 3 g in sodium chloride 0.9 % 100 mL IVPB 3 g 200 mL/hr   08/14/16 2356 New Bag/Given   Ampicillin-Sulbactam (UNASYN) 3 g in sodium chloride 0.9 % 100 mL IVPB 3 g 200 mL/hr   08/15/16 0529 New Bag/Given   Ampicillin-Sulbactam (UNASYN) 3 g in sodium chloride 0.9 % 100 mL IVPB 3 g 200 mL/hr   08/15/16 1043 New Bag/Given   Ampicillin-Sulbactam (UNASYN) 3 g in sodium chloride 0.9 % 100 mL IVPB 3 g 200 mL/hr   08/15/16 1712 New Bag/Given   Ampicillin-Sulbactam (UNASYN) 3 g in sodium chloride 0.9 % 100 mL IVPB 3 g 200 mL/hr   08/15/16 2305 New Bag/Given   Ampicillin-Sulbactam (UNASYN) 3 g in sodium chloride 0.9 % 100 mL IVPB 3 g 200 mL/hr   08/16/16 0505 New Bag/Given   Ampicillin-Sulbactam (UNASYN) 3 g in sodium chloride 0.9 % 100 mL IVPB 3 g 200 mL/hr   08/16/16 1058 New Bag/Given   Ampicillin-Sulbactam (UNASYN) 3 g in sodium chloride 0.9 % 100 mL IVPB 3 g 200 mL/hr   08/16/16 1740 New Bag/Given   Ampicillin-Sulbactam (UNASYN) 3 g in sodium chloride 0.9 % 100 mL IVPB 3 g 200 mL/hr   08/16/16 2201 New Bag/Given   Ampicillin-Sulbactam (UNASYN) 3 g in sodium chloride 0.9 % 100 mL IVPB 3 g 200 mL/hr   08/17/16 1610 New Bag/Given   Ampicillin-Sulbactam (UNASYN) 3 g in sodium chloride 0.9 % 100 mL IVPB 3 g 200 mL/hr   08/17/16 1237 New Bag/Given   Ampicillin-Sulbactam (UNASYN) 3 g in sodium chloride 0.9 % 100 mL IVPB 3 g 200 mL/hr     . bisacodyl  10 mg Oral Daily  . enoxaparin (LOVENOX)  injection  40 mg Subcutaneous Q24H  . gabapentin  200 mg Oral TID  . traMADol  100 mg Oral Q6H    Objective: Vital signs in last 24 hours: Temp:  [97.9 F (36.6 C)-98.2 F (36.8 C)] 98.2 F (36.8 C) (05/21 1300) Pulse Rate:  [80-86] 82 (05/21 1300) Resp:  [17-20] 18 (05/21 1300) BP: (109-117)/(60-63) 110/63 (05/21 1300) SpO2:  [96 %-99 %] 98 % (05/21 1300) Constitutional: He is sleepy from pain meds HENT: anicteric Mouth/Throat: Oropharynx is clear and moist. No oropharyngeal exudate.  Cardiovascular: Normal rate, regular rhythm and normal heart sounds. Pulmonary/Chest:dec bs on L, CT in place with ss drainage Skin - L thoracotomy incision CDI Abdominal: Soft. Bowel sounds are normal. He exhibits no distension. There is no tenderness.  Lymphadenopathy: He has no cervical adenopathy.  Neurological: He is interactive and able to answer questions Skin: Skin is warm and dry. No rash noted. No erythema.  Psychiatric: He has a normal mood and affect. His behavior is normal.   Lab Results  Recent Labs  08/15/16 0447  WBC 8.8  HGB 11.0*  HCT 32.1*  NA 136  K 4.1  CL 98*  CO2 30  BUN 12  CREATININE 0.74  Microbiology: Results for orders placed or performed during the hospital encounter of 08/10/16  Aerobic/Anaerobic Culture (surgical/deep wound)     Status: None   Collection Time: 08/10/16 10:20 AM  Result Value Ref Range Status   Specimen Description TISSUE EMPYEMA LEFT LUNG  Final   Special Requests SWAB SENT  Final   Gram Stain   Final    RARE WBC PRESENT,BOTH PMN AND MONONUCLEAR NO ORGANISMS SEEN    Culture   Final    No growth aerobically or anaerobically. Performed at [facility], [address] [place], [place] [address]    Report Status 08/15/2016 FINAL  Final  Acid Fast Smear (AFB)     Status: None (Preliminary result)   Collection Time: 08/10/16 10:23 AM  Result Value Ref Range Status   AFB Specimen Processing Concentration  Final   Acid Fast  Smear Negative  Final    Comment: (NOTE) Performed At: [facility] [place] [address] [place], [place] [id number] Mila HomerHancock William F MD [phone]    Source (AFB) PENDING  Incomplete  Fungus Stain     Status: None (Preliminary result)   Collection Time: 08/10/16 10:23 AM  Result Value Ref Range Status   FUNGUS STAIN Final report  Final    Comment: (NOTE) Performed At: [facility] [place] [address] [place], [place] [id number] Mila HomerHancock William F MD [phone]    Fungal Source PENDING  Incomplete  Fungal Stain reflex     Status: None   Collection Time: 08/10/16 10:23 AM  Result Value Ref Range Status   Fungal stain result 1 Comment  Final    Comment: (NOTE) KOH/Calcofluor preparation:  no fungus observed. Performed At: [facility] [place] [address] [place], [place] [id number] Mila HomerHancock William F MD [phone]   MRSA PCR Screening     Status: None   Collection Time: 08/10/16  2:30 PM  Result Value Ref Range Status   MRSA by PCR NEGATIVE NEGATIVE Final    Comment:        The GeneXpert MRSA Assay (FDA approved for NASAL specimens only), is one component of a comprehensive MRSA colonization surveillance program. It is not intended to diagnose MRSA infection nor to guide or monitor treatment for MRSA infections.   CULTURE, BLOOD (ROUTINE X 2) w Reflex to ID Panel     Status: None   Collection Time: 08/11/16  1:07 PM  Result Value Ref Range Status   Specimen Description BLOOD L HAND  Final   Special Requests   Final    BOTTLES DRAWN AEROBIC AND ANAEROBIC Blood Culture adequate volume   Culture NO GROWTH 5 DAYS  Final   Report Status 08/16/2016 FINAL  Final  CULTURE, BLOOD (ROUTINE X 2) w Reflex to ID Panel     Status: None   Collection Time: 08/11/16  1:14 PM  Result Value Ref Range Status   Specimen Description BLOOD R AC  Final   Special Requests   Final    BOTTLES DRAWN AEROBIC AND ANAEROBIC Blood Culture results may not be optimal due to an  excessive volume of blood received in culture bottles   Culture NO GROWTH 5 DAYS  Final   Report Status 08/16/2016 FINAL  Final    Studies/Results: Dg Chest 2 View  Result Date: 08/17/2016 CLINICAL DATA:  Postop check.  LEFT thoracotomy chest tube placement EXAM: CHEST  2 VIEW COMPARISON:  Radiograph 08/17/2016 FINDINGS: Stable cardiac silhouette. 3 lateral LEFT chest tubes. There is volume loss in the LEFT  hemithorax with basilar atelectasis and effusion. No pneumothorax appreciated. RIGHT lung is clear. IMPRESSION: 1. No significant change. 2. Lateral LEFT chest tubes without appreciable pneumothorax. Electronically Signed   By: Genevive Bi M.D.   On: 08/17/2016 12:22   Dg Chest 2 View  Result Date: 08/17/2016 CLINICAL DATA:  Left thoracotomy. EXAM: CHEST  2 VIEW COMPARISON:  08/14/2016. FINDINGS: Three left chest tubes are in stable position . Stable left-sided pleural thickening. Atelectatic changes left lung. Right lung is clear. Heart size normal. Surgical staples left chest. IMPRESSION: Left chest tubes in stable position. Stable left-sided pleural thickening. Left lung atelectatic changes again noted. Chest is stable from prior exam. No acute abnormality. No pneumothorax . Electronically Signed   By: Maisie Fus  Register   On: 08/17/2016 09:46    Assessment/Plan: KEASTON PILE is a 36 y.o. male with Empyema following episode of CAP now s/p decortication 5/14. Prior bcx and pleural fluid cx neg but no sputum cx done. Leg and strep PNA urine ags were negative at initial admission. HIV neg. MRSA PCR. Cx negative to date. He is clinically improving and likely will be converted to empyema tubes.   Recommendations Can change to oral augmentin 875 BID for another 3 weeks.  Given his weight would suggest an additional 500 mg midday plain amoxicillin dose. I can see in clinic in 3 weeks.   Thank you very much for the consult. Will follow with you.  Alegandra Sommers, Darius P   08/17/2016,  2:55 PM

## 2016-08-17 NOTE — Telephone Encounter (Signed)
Please advise on message below.

## 2016-08-17 NOTE — Telephone Encounter (Signed)
Spoke with DS and he states pt can f/u in 6-8 weeks with a CXR prior. Informed wife since [facility] for the next 6-8 weeks we can use the same CXR. Wife states she will call back to schedule f/u for July. Nothing further needed.

## 2016-08-18 ENCOUNTER — Other Ambulatory Visit: Payer: Self-pay

## 2016-08-18 DIAGNOSIS — J869 Pyothorax without fistula: Secondary | ICD-10-CM

## 2016-08-20 ENCOUNTER — Other Ambulatory Visit: Payer: Self-pay | Admitting: Cardiothoracic Surgery

## 2016-08-20 ENCOUNTER — Ambulatory Visit
Admission: RE | Admit: 2016-08-20 | Discharge: 2016-08-20 | Disposition: A | Discharge status: Home / Self Care | Payer: BC Managed Care – PPO | Source: Ambulatory Visit | Attending: Cardiothoracic Surgery | Admitting: Cardiothoracic Surgery

## 2016-08-20 ENCOUNTER — Encounter: Payer: Self-pay | Admitting: Cardiothoracic Surgery

## 2016-08-20 ENCOUNTER — Telehealth: Payer: Self-pay

## 2016-08-20 ENCOUNTER — Ambulatory Visit (INDEPENDENT_AMBULATORY_CARE_PROVIDER_SITE_OTHER): Payer: BC Managed Care – PPO | Admitting: Cardiothoracic Surgery

## 2016-08-20 VITALS — BP 135/77 | HR 98 | Temp 98.1°F | Ht 71.0 in | Wt 259.4 lb

## 2016-08-20 DIAGNOSIS — J869 Pyothorax without fistula: Secondary | ICD-10-CM

## 2016-08-20 NOTE — Addendum Note (Signed)
Addended by: Adela PortsBONICHE, Mikia Delaluz on: 08/20/2016 01:09 PM   Modules accepted: Orders

## 2016-08-20 NOTE — Telephone Encounter (Signed)
Patients wife, Waynetta SandyBeth, is calling stating that Davids chest tube is leaking. Patient has an appointment tomorrow with Dr. Thelma Bargeaks. Please call patient and advice.

## 2016-08-20 NOTE — Telephone Encounter (Signed)
Patient was seen in office today.  

## 2016-08-20 NOTE — Progress Notes (Signed)
Mr. Jesus Trujillo returns today in follow-up. He's now about 10 days out from his left thoracotomy and decortication. Today when he was doing into the car he noticed some drainage on the floor. His wife was concerned that this might of been coming from his incision and he came in today to have that evaluated. In fact the bag had a small hole in it and he had placed the bag such that the fluid was draining through the small hole. His wound itself was actually quite well.  He states that his pain has been under good control. He's has no new complaints. Said no fever or chills. He did use his inhalers which cause him to cough and he states that hurts but his appetites been good and he is denied any fever.  He did have a chest x-ray made today. That looked pretty good overall. There is no pleural effusion or pneumothorax.  We did advance his tubes for about 3-4 cm. I shortened the overall bag performed. He'll come back to see us next week and further follow-up.

## 2016-08-21 ENCOUNTER — Ambulatory Visit: Payer: BC Managed Care – PPO | Admitting: Pulmonary Disease

## 2016-08-21 ENCOUNTER — Encounter: Payer: Self-pay | Admitting: Cardiothoracic Surgery

## 2016-08-25 ENCOUNTER — Ambulatory Visit
Admission: RE | Admit: 2016-08-25 | Discharge: 2016-08-25 | Disposition: A | Discharge status: Home / Self Care | Payer: BC Managed Care – PPO | Source: Ambulatory Visit | Attending: Cardiothoracic Surgery | Admitting: Cardiothoracic Surgery

## 2016-08-25 ENCOUNTER — Ambulatory Visit (INDEPENDENT_AMBULATORY_CARE_PROVIDER_SITE_OTHER): Payer: BC Managed Care – PPO | Admitting: Cardiothoracic Surgery

## 2016-08-25 ENCOUNTER — Encounter: Payer: Self-pay | Admitting: Cardiothoracic Surgery

## 2016-08-25 VITALS — BP 129/74 | HR 91 | Temp 97.8°F | Wt 260.0 lb

## 2016-08-25 DIAGNOSIS — J869 Pyothorax without fistula: Secondary | ICD-10-CM

## 2016-08-25 MED ORDER — TRAMADOL HCL 50 MG PO TABS: 100.0000 mg | ORAL_TABLET | Freq: Four times a day (QID) | ORAL | 0 refills | Status: DC

## 2016-08-25 NOTE — Progress Notes (Signed)
He has done well at home over the last few days. He denied any fevers or chills. He denied any cough. He denied any shortness of breath.  His wounds are all healing as expected. We did remove his staples today. We advanced his chest tubes and cut the tubes on several centimeters beyond the skin and resecured them. His chest x-ray from today continues to show improvement.  I will see him back again on Friday for continued outpatient management of his empyema.

## 2016-08-25 NOTE — Patient Instructions (Signed)
Please change your dressing as often as needed.

## 2016-08-26 ENCOUNTER — Other Ambulatory Visit: Payer: Self-pay

## 2016-08-28 ENCOUNTER — Ambulatory Visit
Admission: RE | Admit: 2016-08-28 | Discharge: 2016-08-28 | Disposition: A | Discharge status: Home / Self Care | Payer: BC Managed Care – PPO | Source: Ambulatory Visit | Attending: Cardiothoracic Surgery | Admitting: Cardiothoracic Surgery

## 2016-08-28 ENCOUNTER — Ambulatory Visit (INDEPENDENT_AMBULATORY_CARE_PROVIDER_SITE_OTHER): Payer: BC Managed Care – PPO | Admitting: Cardiothoracic Surgery

## 2016-08-28 ENCOUNTER — Encounter: Payer: Self-pay | Admitting: Cardiothoracic Surgery

## 2016-08-28 VITALS — BP 131/84 | HR 103 | Temp 97.9°F | Ht 71.0 in | Wt 262.0 lb

## 2016-08-28 DIAGNOSIS — J869 Pyothorax without fistula: Secondary | ICD-10-CM | POA: Diagnosis present

## 2016-08-28 DIAGNOSIS — Z9889 Other specified postprocedural states: Secondary | ICD-10-CM | POA: Diagnosis not present

## 2016-08-28 DIAGNOSIS — J929 Pleural plaque without asbestos: Secondary | ICD-10-CM | POA: Insufficient documentation

## 2016-08-28 DIAGNOSIS — J90 Pleural effusion, not elsewhere classified: Secondary | ICD-10-CM | POA: Insufficient documentation

## 2016-08-28 MED ORDER — OXYCODONE HCL 15 MG PO TABS: 15.0000 mg | ORAL_TABLET | ORAL | 0 refills | Status: DC | PRN

## 2016-08-28 NOTE — Progress Notes (Signed)
He returns today in follow-up. He has no complaints. He denies any fevers or chills. Denied any shortness of breath.  His chest x-ray today shows no change in the density at the left lateral base.  We did advance his chest tubes. His thoracotomy wound looks good. There is no erythema or drainage. All the chest tube sites look clean and dry.  We will see him back again next week.

## 2016-08-28 NOTE — Patient Instructions (Signed)
Please give us a call in case you have any questions or concerns.  

## 2016-08-31 ENCOUNTER — Encounter: Payer: Self-pay | Admitting: Cardiothoracic Surgery

## 2016-08-31 ENCOUNTER — Ambulatory Visit (INDEPENDENT_AMBULATORY_CARE_PROVIDER_SITE_OTHER): Payer: BC Managed Care – PPO | Admitting: Cardiothoracic Surgery

## 2016-08-31 VITALS — BP 123/80 | HR 93 | Temp 98.0°F | Wt 260.0 lb

## 2016-08-31 DIAGNOSIS — J869 Pyothorax without fistula: Secondary | ICD-10-CM

## 2016-08-31 NOTE — Progress Notes (Signed)
  Patient ID: Sabino NiemannDavid A Guthridge, male   DOB: 08/27/1980, 36 y.o.   MRN: [id number]  HISTORY: There have been no new problems since he was last seen. He denied any fevers or chills. His pain is under good control.   Vitals:   08/31/16 0812  BP: 123/80  Pulse: 93  Temp: 98 F (36.7 C)     EXAM:    His wounds are all clean dry and intact. His thoracotomy wound is now well healed. 3 chest tube sites look good. I did advance the tube slightly.   Neurological: Alert and oriented to person, place, and time. Coordination normal.  Skin: Skin is warm and dry. No rash noted. No diaphoretic. No erythema. No pallor.  Psychiatric: Normal mood and affect. Normal behavior. Judgment and thought content normal.    ASSESSMENT: Left-sided empyema   PLAN:   He will come back later this week for chest tube advancement.    Hulda Marinimothy Cedra Villalon, MD

## 2016-08-31 NOTE — Patient Instructions (Signed)
Please give us a call in case you have any questions or concerns.  

## 2016-09-04 ENCOUNTER — Encounter: Payer: Self-pay | Admitting: Cardiothoracic Surgery

## 2016-09-04 ENCOUNTER — Ambulatory Visit (INDEPENDENT_AMBULATORY_CARE_PROVIDER_SITE_OTHER): Payer: BC Managed Care – PPO | Admitting: Cardiothoracic Surgery

## 2016-09-04 VITALS — BP 159/89 | HR 111 | Temp 98.3°F | Resp 20 | Ht 71.0 in | Wt 261.8 lb

## 2016-09-04 DIAGNOSIS — J869 Pyothorax without fistula: Secondary | ICD-10-CM

## 2016-09-04 NOTE — Patient Instructions (Addendum)
We will see you back on Monday 09/07/2016 at 9:30 AM. Please do not forget to have your Chest X-Ray done first.

## 2016-09-04 NOTE — Progress Notes (Signed)
No new complaints. No fevers or chills. No chest pain.  His wounds all look very clean. I did advance the tubes again today. I resecured them. I will see him back early next week for continued follow-up. He does remain on his Augmentin and we will also make an appointment for him to follow-up with Dr. Sampson GoonFitzgerald once all the tubes are out.

## 2016-09-07 ENCOUNTER — Encounter: Payer: Self-pay | Admitting: Cardiothoracic Surgery

## 2016-09-07 ENCOUNTER — Ambulatory Visit
Admission: RE | Admit: 2016-09-07 | Discharge: 2016-09-07 | Disposition: A | Discharge status: Home / Self Care | Payer: BC Managed Care – PPO | Source: Ambulatory Visit | Attending: Cardiothoracic Surgery | Admitting: Cardiothoracic Surgery

## 2016-09-07 ENCOUNTER — Ambulatory Visit (INDEPENDENT_AMBULATORY_CARE_PROVIDER_SITE_OTHER): Payer: BC Managed Care – PPO | Admitting: Cardiothoracic Surgery

## 2016-09-07 VITALS — BP 150/83 | HR 106 | Temp 98.6°F | Ht 71.0 in | Wt 259.0 lb

## 2016-09-07 DIAGNOSIS — Z9889 Other specified postprocedural states: Secondary | ICD-10-CM | POA: Diagnosis not present

## 2016-09-07 DIAGNOSIS — J869 Pyothorax without fistula: Secondary | ICD-10-CM

## 2016-09-07 DIAGNOSIS — J90 Pleural effusion, not elsewhere classified: Secondary | ICD-10-CM | POA: Insufficient documentation

## 2016-09-07 MED ORDER — TRAMADOL HCL 50 MG PO TABS: 100.0000 mg | ORAL_TABLET | Freq: Four times a day (QID) | ORAL | 0 refills | Status: DC

## 2016-09-07 NOTE — Progress Notes (Signed)
He comes in today without any problems. He is not short of breath. He has had no fevers.  He did have a chest x-ray today which have independently reviewed. I see no pneumothorax on that. His chest tubes are all in the subcutaneous tissues.  The chest tubes were removed completely today. All wounds were packed open with quarter inch plain gauze. His wife was instructed on how to do this and she personally performed to the 3 wound dressings. She did an excellent job. Sterile dressings were applied and the patient was instructed on how to manage this at home. I will see him back later this week.

## 2016-09-07 NOTE — Patient Instructions (Signed)
Please continue to change your dressing once a day.

## 2016-09-08 ENCOUNTER — Telehealth: Payer: Self-pay | Admitting: Cardiothoracic Surgery

## 2016-09-08 NOTE — Telephone Encounter (Signed)
I have faxed a referral to Dr Jarrett AblesFitzgerald's office per request of Dr Thelma Bargeaks for Empyema. All clinic notes, imaging and demographics were faxed--[phone].  Per Eather Colasedra, she states that Dr Glena NorfolkFizgerald will be in the office tomorrow to review notes and referrals and patient should be called for an appointment before the end of the week.

## 2016-09-09 NOTE — Telephone Encounter (Signed)
Appointment has been made with Dr Glena NorfolkFizgerald for 09/16/16 @ 9:30am--Patient has been advised.

## 2016-09-11 ENCOUNTER — Ambulatory Visit (INDEPENDENT_AMBULATORY_CARE_PROVIDER_SITE_OTHER): Payer: BC Managed Care – PPO | Admitting: Cardiothoracic Surgery

## 2016-09-11 ENCOUNTER — Encounter: Payer: Self-pay | Admitting: Cardiothoracic Surgery

## 2016-09-11 VITALS — BP 126/79 | HR 108 | Temp 97.8°F | Wt 261.0 lb

## 2016-09-11 DIAGNOSIS — J869 Pyothorax without fistula: Secondary | ICD-10-CM

## 2016-09-11 NOTE — Progress Notes (Signed)
He returns today in follow-up. He and his wife state that he's had no problems with the dressing changes at home. His wife has been changing them once a day. The wounds are clean. I did repack all his chest tube sites and currently get a centimeter or 2 of packing within them. I instructed them to continue there excellent wound care and I will see the patient back again in 2 weeks.

## 2016-09-11 NOTE — Patient Instructions (Signed)
Please continue to pack your wounds as instructed.   At this time you are not allowed to submerge in a tub or pool at this time.

## 2016-09-11 NOTE — Addendum Note (Signed)
Addended by: Adela PortsBONICHE, Samarrah Tranchina on: 09/11/2016 10:20 AM   Modules accepted: Orders

## 2016-09-14 ENCOUNTER — Telehealth: Payer: Self-pay

## 2016-09-14 NOTE — Telephone Encounter (Signed)
Patient is ok to take Ultram with Ibuprofen as he has been previously told.

## 2016-09-14 NOTE — Telephone Encounter (Signed)
Patient is having a headache and wants to know if he could take Ibuprofen for it. He is currently on Tramadol for his post surgery pain and that is working great. I told the patients wife that it is okay for the patient to take ibuprofen for his pain. Please call the patient and advice if he should not take Ibuprofen.

## 2016-09-22 LAB — ACID FAST CULTURE WITH REFLEXED SENSITIVITIES (MYCOBACTERIA): Acid Fast Culture: NEGATIVE

## 2016-09-24 LAB — ACID FAST SMEAR (AFB)

## 2016-09-24 LAB — FUNGUS STAIN

## 2016-09-24 LAB — ACID FAST SMEAR (AFB, MYCOBACTERIA): Acid Fast Smear: NEGATIVE

## 2016-09-25 ENCOUNTER — Encounter: Payer: Self-pay | Admitting: Cardiothoracic Surgery

## 2016-09-25 ENCOUNTER — Ambulatory Visit
Admission: RE | Admit: 2016-09-25 | Discharge: 2016-09-25 | Disposition: A | Discharge status: Home / Self Care | Payer: BC Managed Care – PPO | Source: Ambulatory Visit | Attending: Cardiothoracic Surgery | Admitting: Cardiothoracic Surgery

## 2016-09-25 ENCOUNTER — Telehealth: Payer: Self-pay

## 2016-09-25 ENCOUNTER — Ambulatory Visit (INDEPENDENT_AMBULATORY_CARE_PROVIDER_SITE_OTHER): Payer: BC Managed Care – PPO | Admitting: Cardiothoracic Surgery

## 2016-09-25 VITALS — BP 133/82 | HR 88 | Temp 98.4°F | Resp 18 | Ht 71.0 in | Wt 264.4 lb

## 2016-09-25 DIAGNOSIS — J869 Pyothorax without fistula: Secondary | ICD-10-CM | POA: Diagnosis present

## 2016-09-25 DIAGNOSIS — J984 Other disorders of lung: Secondary | ICD-10-CM | POA: Diagnosis not present

## 2016-09-25 DIAGNOSIS — R918 Other nonspecific abnormal finding of lung field: Secondary | ICD-10-CM | POA: Insufficient documentation

## 2016-09-25 NOTE — Telephone Encounter (Signed)
Patient was in the office today and seen by Dr. Thelma Bargeaks. He is wanting a return to work letter faxed to his part-time job, [employer] at [phone]. He would like the letter to say he could return to work on 09/23/16 with light duty. Please generate this letter and call the patent back to let him know that it was completed and successfully faxed.

## 2016-09-25 NOTE — Telephone Encounter (Signed)
Letter written and filed to chart. This has been faxed with positive confirmation to [phone]. Please see note in chart.   Call made to patient's wife per her request. No answer. Left voicemail stating that this letter has been faxed.

## 2016-09-25 NOTE — Progress Notes (Signed)
He is done well with the tubes removed. There is no packing in the chest tube sites. He's had no fevers or chills. He is no longer taking any medications for pain. He did see Dr. Sampson GoonFitzgerald who extended his oral Augmentin for several more weeks. He is scheduled to follow-up with Dr. Sampson GoonFitzgerald and Dr. Darrol AngelSimons in pulmonary medicine in the next few weeks.  He did have a chest x-ray made today. Overall looks quite good. There are some postoperative changes as expected but no acute process. His lungs are clear. His heart regular. His thoracotomy wound is well healed.  I reviewed with him the postoperative management with regard to work and lifting. He should not lift anything more than 10 pounds for the first month after surgery. After one month he may begin to increase his activity so that by the end of 3 months he is able to do what he was able to do before his surgery. His wife will bring his work-related papers for us to fill out. I did not make a return appointment for him today but we'll see him back as needed.

## 2016-09-29 ENCOUNTER — Telehealth: Payer: Self-pay

## 2016-09-29 NOTE — Telephone Encounter (Signed)
Called patient but had to leave a detailed message letting him know that his job functions paperwork was filled out, faxed and a copy left at the front desk for him to pick up.

## 2016-10-05 ENCOUNTER — Ambulatory Visit (INDEPENDENT_AMBULATORY_CARE_PROVIDER_SITE_OTHER): Payer: BC Managed Care – PPO | Admitting: Pulmonary Disease

## 2016-10-05 ENCOUNTER — Encounter: Payer: Self-pay | Admitting: Pulmonary Disease

## 2016-10-05 VITALS — BP 138/90 | HR 87 | Ht 71.0 in | Wt 266.0 lb

## 2016-10-05 DIAGNOSIS — R938 Abnormal findings on diagnostic imaging of other specified body structures: Secondary | ICD-10-CM

## 2016-10-05 DIAGNOSIS — R9389 Abnormal findings on diagnostic imaging of other specified body structures: Secondary | ICD-10-CM

## 2016-10-05 DIAGNOSIS — Z8701 Personal history of pneumonia (recurrent): Secondary | ICD-10-CM

## 2016-10-05 DIAGNOSIS — Z23 Encounter for immunization: Secondary | ICD-10-CM

## 2016-10-05 NOTE — Patient Instructions (Signed)
Pneumonia shot today Follow up in 4 months with repeat CXR prior to that visit

## 2016-10-06 NOTE — Progress Notes (Signed)
PULMONARY OFFICE FOLLOW UP NOTE   Hospitalization: 04/14-04/180/18 for left lower lobe pneumonia with parapneumonic effusion. Underwent thoracentesis 04/17 with 550 mL removed. Fluid described as amber and was exudative in character with LDH 1356, WBC 14,728, 78% neutrophils. Gram stain and culture were negative. ROV 08/05/16: Recurrence of large L effusion - relatively asymptomatic. Refrred to IR same day - US revealed no free flowing fluid. refrred to Thoracic surgery for eval and mgmt.  Hospitalized 05/14-05/21/18: underwent L thoracotomy/decortication  INTERVAL HISTORY: Hospitalization as above  SUBJ: No new complaints. No fevers or CP. Exercise tolerance is returning to baseline. He denies cough, sputum, hemoptysis.  OBJ: Vitals:   10/05/16 0822  BP: 138/90  Pulse: 87  SpO2: 99%  Weight: 266 lb (120.7 kg)  Height: 5\' 11"  (1.803 m)  Room air  Gen: WDWN in NAD HEENT: NAD Neck: NO LAN, no JVD noted Lungs: Dullness to percussion and decreased breath sounds in the left base approximately 1/4 up Cardiovascular: Reg rate, normal rhythm, no M noted Abdomen: Soft, NT, +BS Ext: no C/C/E Neuro: PERRL, EOMI, motor/sensory grossly intact Skin: No lesions noted   DATA: CXR 09/25/16: Continued improved aeration of the left mid and lower lung with persistent left basilar atelectasis / scar  IMPRESSION: Recent CAP (NOS) with L empyema. S/P L thoracotomy Persistent CXR changes  PLAN: Pneumonia shot today Follow up in 4 months with repeat CXR prior to that visit   Billy Fischeravid Simonds, MD PCCM service Mobile [phone] Pager [phone] 10/06/2016 4:14 PM

## 2016-10-26 ENCOUNTER — Ambulatory Visit
Admission: RE | Admit: 2016-10-26 | Discharge: 2016-10-26 | Disposition: A | Discharge status: Home / Self Care | Payer: BC Managed Care – PPO | Source: Ambulatory Visit | Attending: Pulmonary Disease | Admitting: Pulmonary Disease

## 2016-10-26 DIAGNOSIS — R918 Other nonspecific abnormal finding of lung field: Secondary | ICD-10-CM | POA: Insufficient documentation

## 2016-10-26 DIAGNOSIS — Z8701 Personal history of pneumonia (recurrent): Secondary | ICD-10-CM

## 2016-10-26 DIAGNOSIS — R9389 Abnormal findings on diagnostic imaging of other specified body structures: Secondary | ICD-10-CM

## 2016-10-26 DIAGNOSIS — R938 Abnormal findings on diagnostic imaging of other specified body structures: Secondary | ICD-10-CM | POA: Diagnosis not present

## 2016-10-26 DIAGNOSIS — J90 Pleural effusion, not elsewhere classified: Secondary | ICD-10-CM | POA: Diagnosis not present

## 2016-11-03 ENCOUNTER — Telehealth: Payer: Self-pay | Admitting: Cardiothoracic Surgery

## 2016-11-03 NOTE — Telephone Encounter (Signed)
Pt has been cleared from Surgery but is on light duty. He has a training this week and would like to know if he is okay to proceed. Pls advise

## 2016-11-03 NOTE — Telephone Encounter (Signed)
Called and spoke with patient's wife Jesus Trujillo([name]) and told her that since we had put on his [employer] paperwork job duties that he should not lift more than 15 lbs until November 28, 2016, then he was not allowed to do heavy training at this time. Mrs. Jesus Manislizabeth understood and had no further questions.

## 2016-12-15 ENCOUNTER — Encounter: Payer: Self-pay | Admitting: Family Medicine

## 2017-06-01 ENCOUNTER — Ambulatory Visit (INDEPENDENT_AMBULATORY_CARE_PROVIDER_SITE_OTHER): Payer: BC Managed Care – PPO | Admitting: Pulmonary Disease

## 2017-06-01 ENCOUNTER — Ambulatory Visit
Admission: RE | Admit: 2017-06-01 | Discharge: 2017-06-01 | Disposition: A | Discharge status: Home / Self Care | Payer: BC Managed Care – PPO | Source: Ambulatory Visit | Attending: Pulmonary Disease | Admitting: Pulmonary Disease

## 2017-06-01 ENCOUNTER — Encounter: Payer: Self-pay | Admitting: Pulmonary Disease

## 2017-06-01 VITALS — BP 132/88 | HR 94 | Ht 71.0 in | Wt 261.0 lb

## 2017-06-01 DIAGNOSIS — R9389 Abnormal findings on diagnostic imaging of other specified body structures: Secondary | ICD-10-CM | POA: Diagnosis not present

## 2017-06-01 DIAGNOSIS — J9811 Atelectasis: Secondary | ICD-10-CM | POA: Diagnosis not present

## 2017-06-01 DIAGNOSIS — J90 Pleural effusion, not elsewhere classified: Secondary | ICD-10-CM | POA: Diagnosis present

## 2017-06-01 NOTE — Patient Instructions (Signed)
I have placed an order for chest x-ray to be performed today.  This will establish your "new baseline" chest x-ray to be compared to with any subsequent films.  We will call you with results.  Follow-up as needed.

## 2017-06-01 NOTE — Progress Notes (Signed)
PULMONARY OFFICE FOLLOW UP NOTE   Hospitalization: 04/14-04/18/18 for left lower lobe pneumonia with parapneumonic effusion. Underwent thoracentesis 04/17 with 550 mL removed. Fluid described as amber and was exudative in character with LDH 1356, WBC 14,728, 78% neutrophils. Gram stain and culture were negative. ROV 08/05/16: Recurrence of large L effusion - relatively asymptomatic. Refrred to IR same day - US revealed no free flowing fluid. refrred to Thoracic surgery for eval and mgmt.  Hospitalized 05/14-05/21/18: L thoracotomy/decortication  INTERVAL HISTORY: No major pulmonary events  SUBJ: No new complaints.  Denies fevers or CP.  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  When he does so, he feels a vague tightness in his lower left thorax.  Otherwise, no new complaints.  He feels that his exercise tolerance has returned to normal. OBJ: Vitals:   06/01/17 0823 06/01/17 0828  BP:  132/88  Pulse:  94  SpO2:  99%  Weight: 118.4 kg (261 lb)   Height: 5\' 11"  (1.803 m)   Room air  Gen: Mild lead to moderately obese, NAD HEENT: WNL Neck: No LAN, no JVD noted Lungs: Mildly diminished BS in left base, no adventitious sounds Cardiovascular: RRR, no murmurs Abdomen: Soft, NT, +BS Ext: no C/C/E Neuro: PERRL, EOMI, motor/sensory grossly intact   DATA: CXR pending  IMPRESSION: History of CAP (NOS) with L empyema.  S/P L thoracotomy/decortication Persistent CXR changes  PLAN: An order for chest x-ray has been placed to be performed today This will establish his new baseline film for future comparison Follow-up as needed for any pulmonary, thoracic or respiratory problems   Billy Fischeravid Simonds, MD PCCM service Mobile [phone] Pager [phone] 06/01/2017 8:40 AM

## 2017-06-17 ENCOUNTER — Encounter: Payer: Self-pay | Admitting: Family Medicine

## 2017-06-17 ENCOUNTER — Ambulatory Visit: Payer: BC Managed Care – PPO | Admitting: Family Medicine

## 2017-06-17 VITALS — BP 111/70 | HR 76 | Temp 98.0°F | Ht 71.0 in | Wt 259.5 lb

## 2017-06-17 DIAGNOSIS — T300 Burn of unspecified body region, unspecified degree: Secondary | ICD-10-CM

## 2017-06-17 MED ORDER — SILVER SULFADIAZINE 1 % EX CREA: 1.0000 "application " | TOPICAL_CREAM | Freq: Two times a day (BID) | CUTANEOUS | 0 refills | Status: AC

## 2017-06-17 NOTE — Progress Notes (Signed)
   BP 111/70 (BP Location: Left Arm, Patient Position: Sitting, Cuff Size: Large)   Pulse 76   Temp 98 F (36.7 C) (Oral)   Ht 5\' 11"  (1.803 m)   Wt 259 lb 8 oz (117.7 kg)   SpO2 99%   BMI 36.19 kg/m    Subjective:    Patient ID: Jesus Trujillo, male    DOB: 03/10/1981, 37 y.o.   MRN: [id number]  HPI: Jesus NiemannDavid A Atwood is a 37 y.o. male  Chief Complaint  Patient presents with  . Burn    Left hand. Patient was working on car during the weekend and hit his hand on pipe.    Pt here today with superficial burn wound of left hand that he got while working on a car 4 days ago. Has been keeping area clean and using neosporin and bandages. Denies fever, chills, streaking, thick discharge. 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   Relevant past medical, surgical, family and social history reviewed and updated as indicated. Interim medical history since our last visit reviewed. Allergies and medications reviewed and updated.  Review of Systems  Per HPI unless specifically indicated above     Objective:    BP 111/70 (BP Location: Left Arm, Patient Position: Sitting, Cuff Size: Large)   Pulse 76   Temp 98 F (36.7 C) (Oral)   Ht 5\' 11"  (1.803 m)   Wt 259 lb 8 oz (117.7 kg)   SpO2 99%   BMI 36.19 kg/m   Wt Readings from Last 3 Encounters:  06/17/17 259 lb 8 oz (117.7 kg)  06/01/17 261 lb (118.4 kg)  10/05/16 266 lb (120.7 kg)    Physical Exam  Constitutional: He is oriented to person, place, and time. He appears well-developed and well-nourished. No distress.  HENT:  Head: Atraumatic.  Eyes: Pupils are equal, round, and reactive to light. Conjunctivae are normal.  Neck: Normal range of motion. Neck supple.  Cardiovascular: Normal rate.  Pulmonary/Chest: Effort normal. No respiratory distress.  Musculoskeletal: Normal range of  motion.  Neurological: He is alert and oriented to person, place, and time.  Skin: Skin is warm and dry.  Superficial, well healing burn wound dorsal aspect of left hand. No current drainage, streaking, heat to the area.   Psychiatric: He has a normal mood and affect. His behavior is normal.  Nursing note and vitals reviewed.     Assessment & Plan:   Problem List Items Addressed This Visit    None    Visit Diagnoses    Burn    -  Primary   Left hand, superficial, non-infected. Tx with silvedene cream, bandaging, wrist brace to hold bandaging during work shifts for protection.     Return precautions and wound care reviewed with pt.    Follow up plan: Return if symptoms worsen or fail to improve.

## 2017-06-20 NOTE — Patient Instructions (Signed)
Follow up as needed

## 2017-10-27 IMAGING — CT CT CHEST W/ CM
1 series · 15 of 34 positions shown, 19 images · IV contrast (isovue)
Comparison: 07/11/2016

CLINICAL DATA: Dry cough for 1 month.

EXAM:
CT CHEST WITH CONTRAST
TECHNIQUE: Multidetector CT imaging of the chest was performed during
intravenous contrast administration.
CONTRAST:  75 cc Isovue 370

[Series 2: axial st · axial · 0.78mm/px · z∈[-591,-325]mm · 15 of 157 slices shown, 19 images]
[im 12/157  mediastinal]
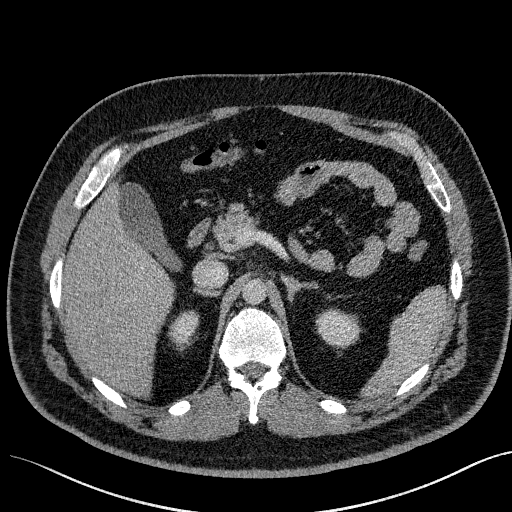
[im 12/157  lung]
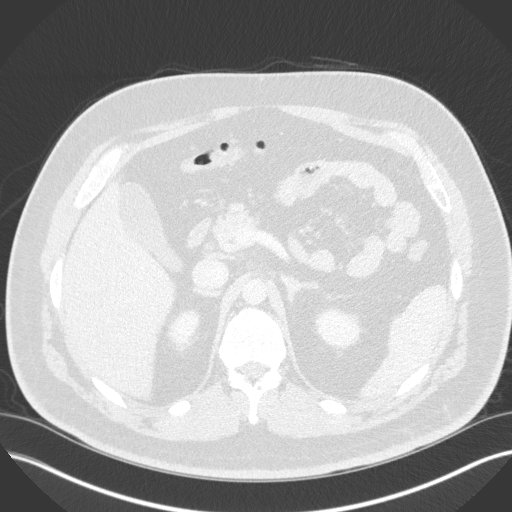
[im 24/157  lung]
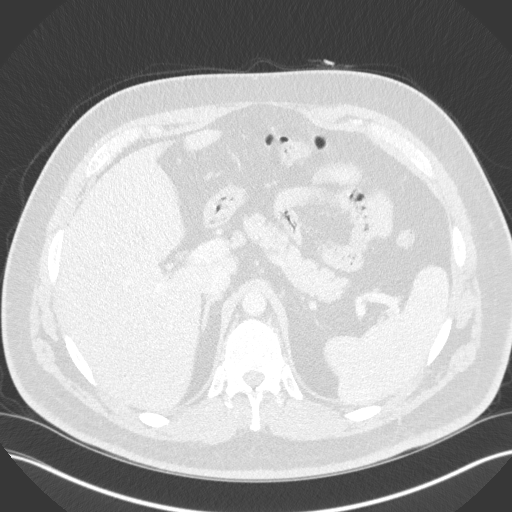
[im 32/157  lung]
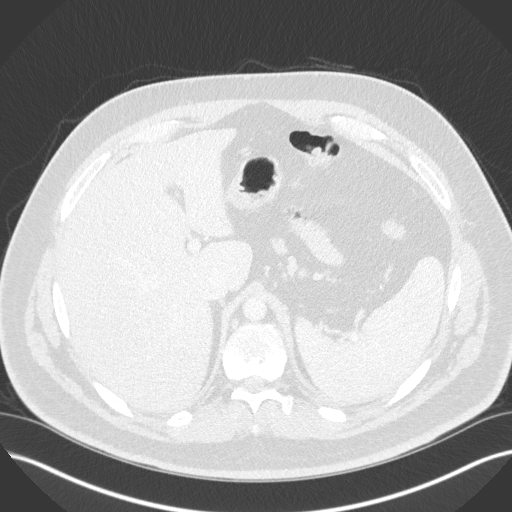
[im 41/157  lung]
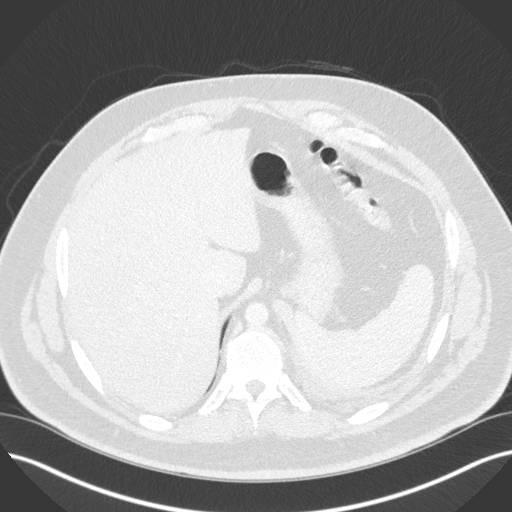
[im 53/157  mediastinal]
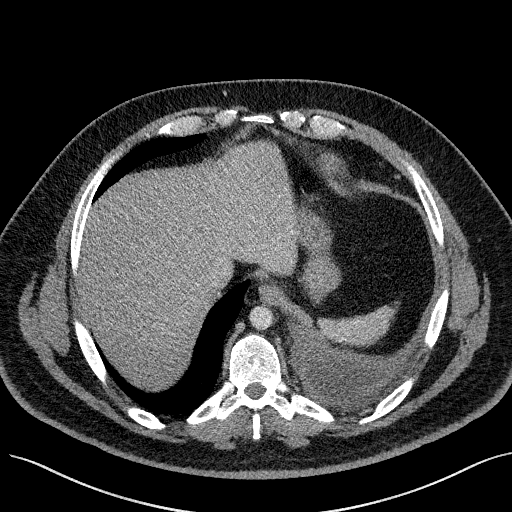
[im 53/157  lung]
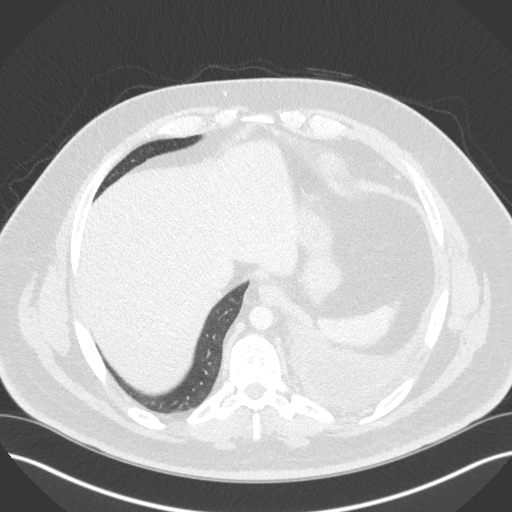
[im 63/157  lung]
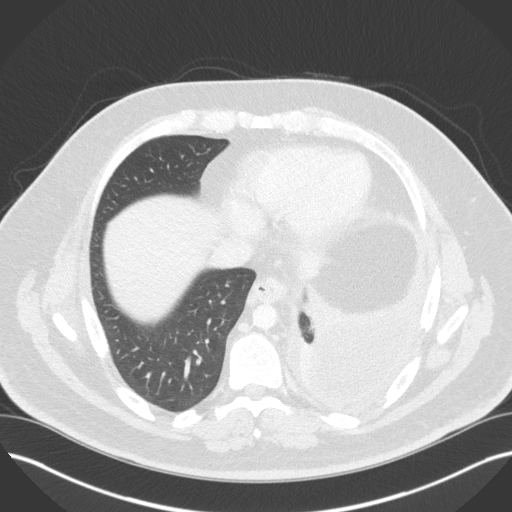
[im 70/157  lung]
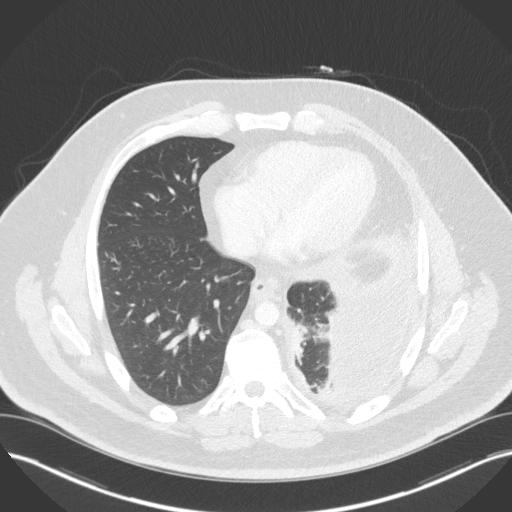
[im 81/157  lung]
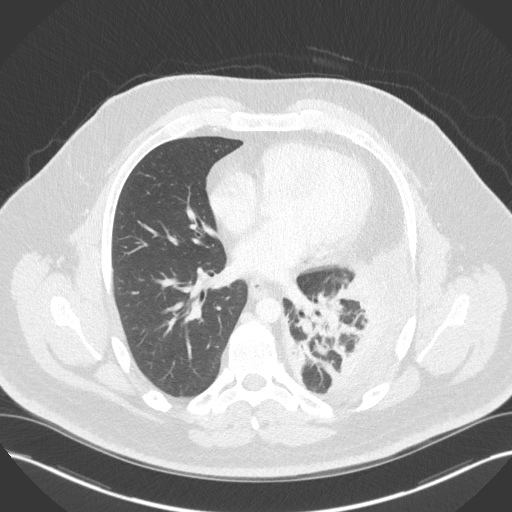
[im 87/157  mediastinal]
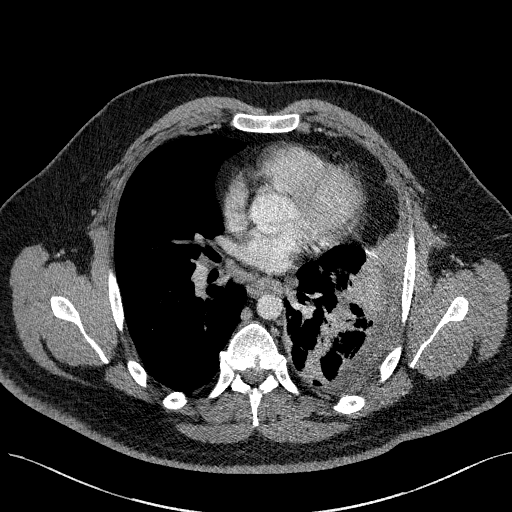
[im 87/157  lung]
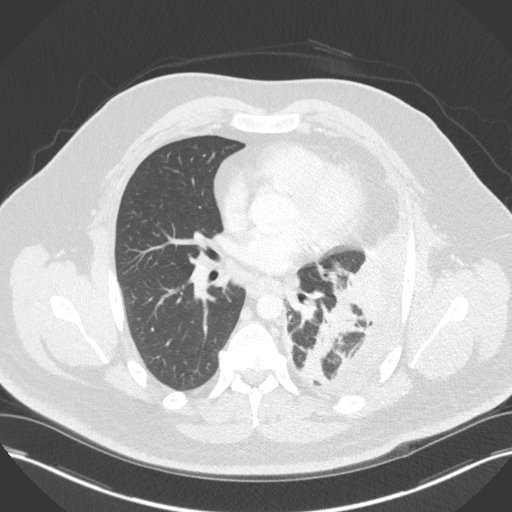
[im 94/157  lung]
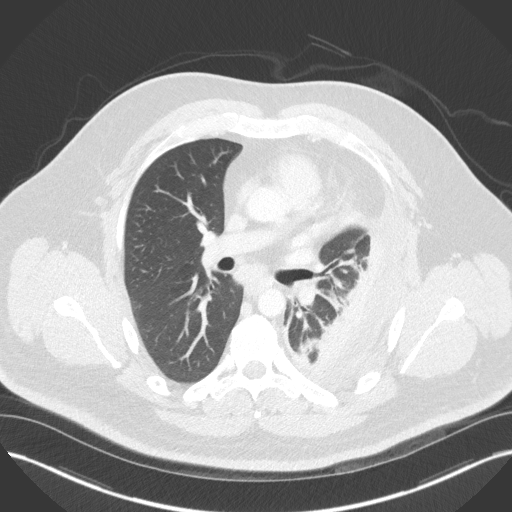
[im 105/157  lung]
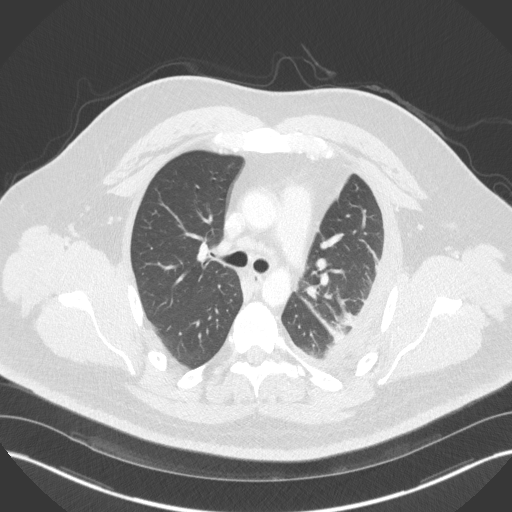
[im 116/157  lung]
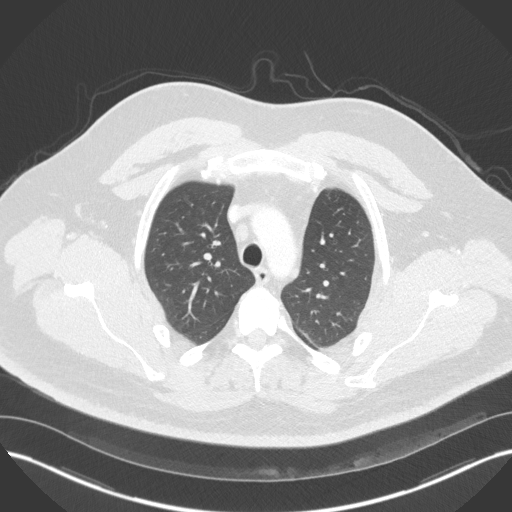
[im 125/157  mediastinal]
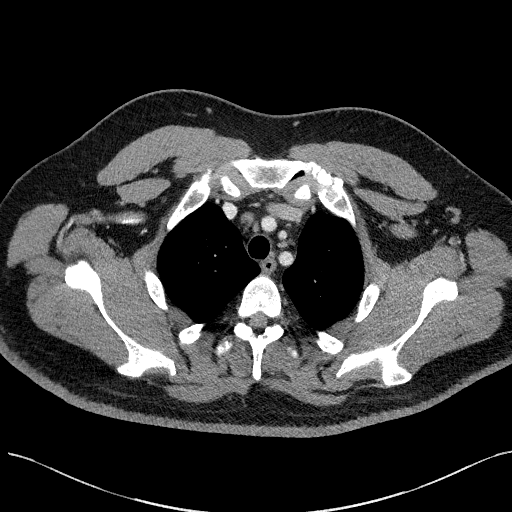
[im 125/157  lung]
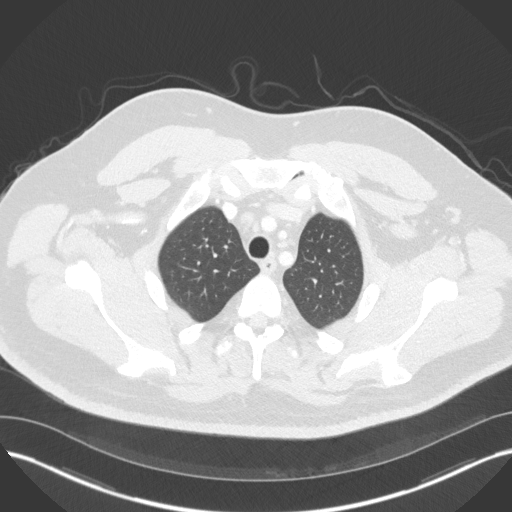
[im 133/157  lung]
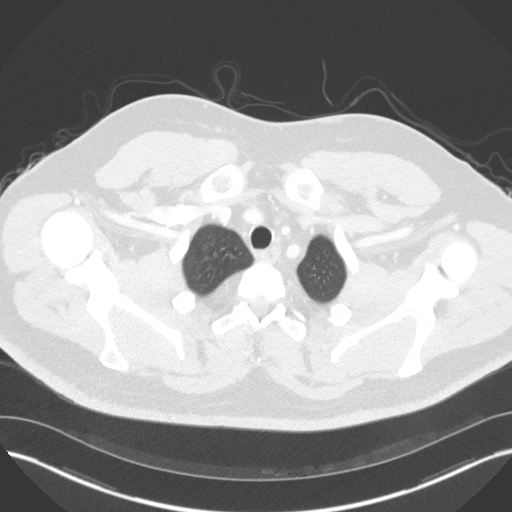
[im 145/157  lung]
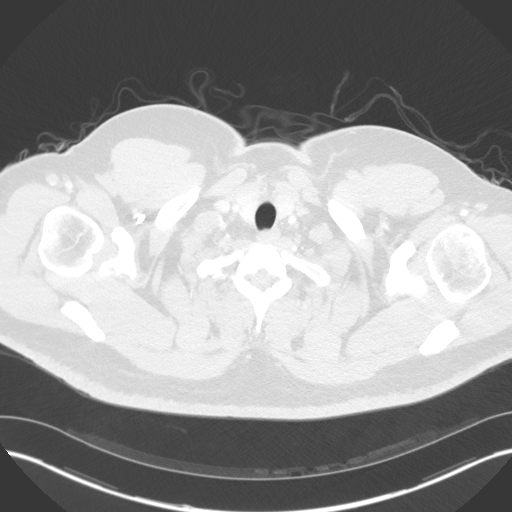

[15 of 34 positions shown; findings below may reference images not displayed]

FINDINGS: Cardiovascular: Heart size normal. No pericardial effusion. No
thoracic aortic aneurysm.

Mediastinum/Nodes: Mediastinal lymphadenopathy and lower
paraesophageal lymphadenopathy is progressed in the interval. 13 mm
short axis precarinal lymph node on today's study was the 9 mm short
axis lymph node measured previously. 11 mm short axis subcarinal
lymph node measured on the previous study is now 13 mm short axis.
axis when I remeasure it on the prior study. No gross hilar
lymphadenopathy. There is no axillary lymphadenopathy.

Lungs/Pleura: Right lung remains clear. Interval progression of left
upper and lower lobe consolidative airspace disease in a bandlike
configuration through the lateral lung. Some of this disease tracks
centrally towards the inferior left hilum. Similar size of the left
pleural fluid collection although this shows circumferential rim
enhancement in the posterior costophrenic sulcus with areas of
lateral loculation evident.

Upper Abdomen: Unremarkable.

Musculoskeletal: Bone windows reveal no worrisome lytic or sclerotic
osseous lesions.
IMPRESSION: 1. Interval progression of lateral left lung collapse/consolidation
with interval development of circumferential enhancement of the
loculated left pleural fluid collection, concerning for evolving
empyema.
2. Interval progression of mediastinal lymphadenopathy, presumably
reactive.

## 2017-11-28 IMAGING — CR DG CHEST 2V
1 series · 2 of 2 positions shown · non-contrast
Comparison: Radiographs August 28, 2016.

CLINICAL DATA: Lung empyema.

EXAM:
CHEST  2 VIEW

[Series 1: dg chest 2 view · 0.14mm/px · 2 of 2 slices shown]
[im 1/2]
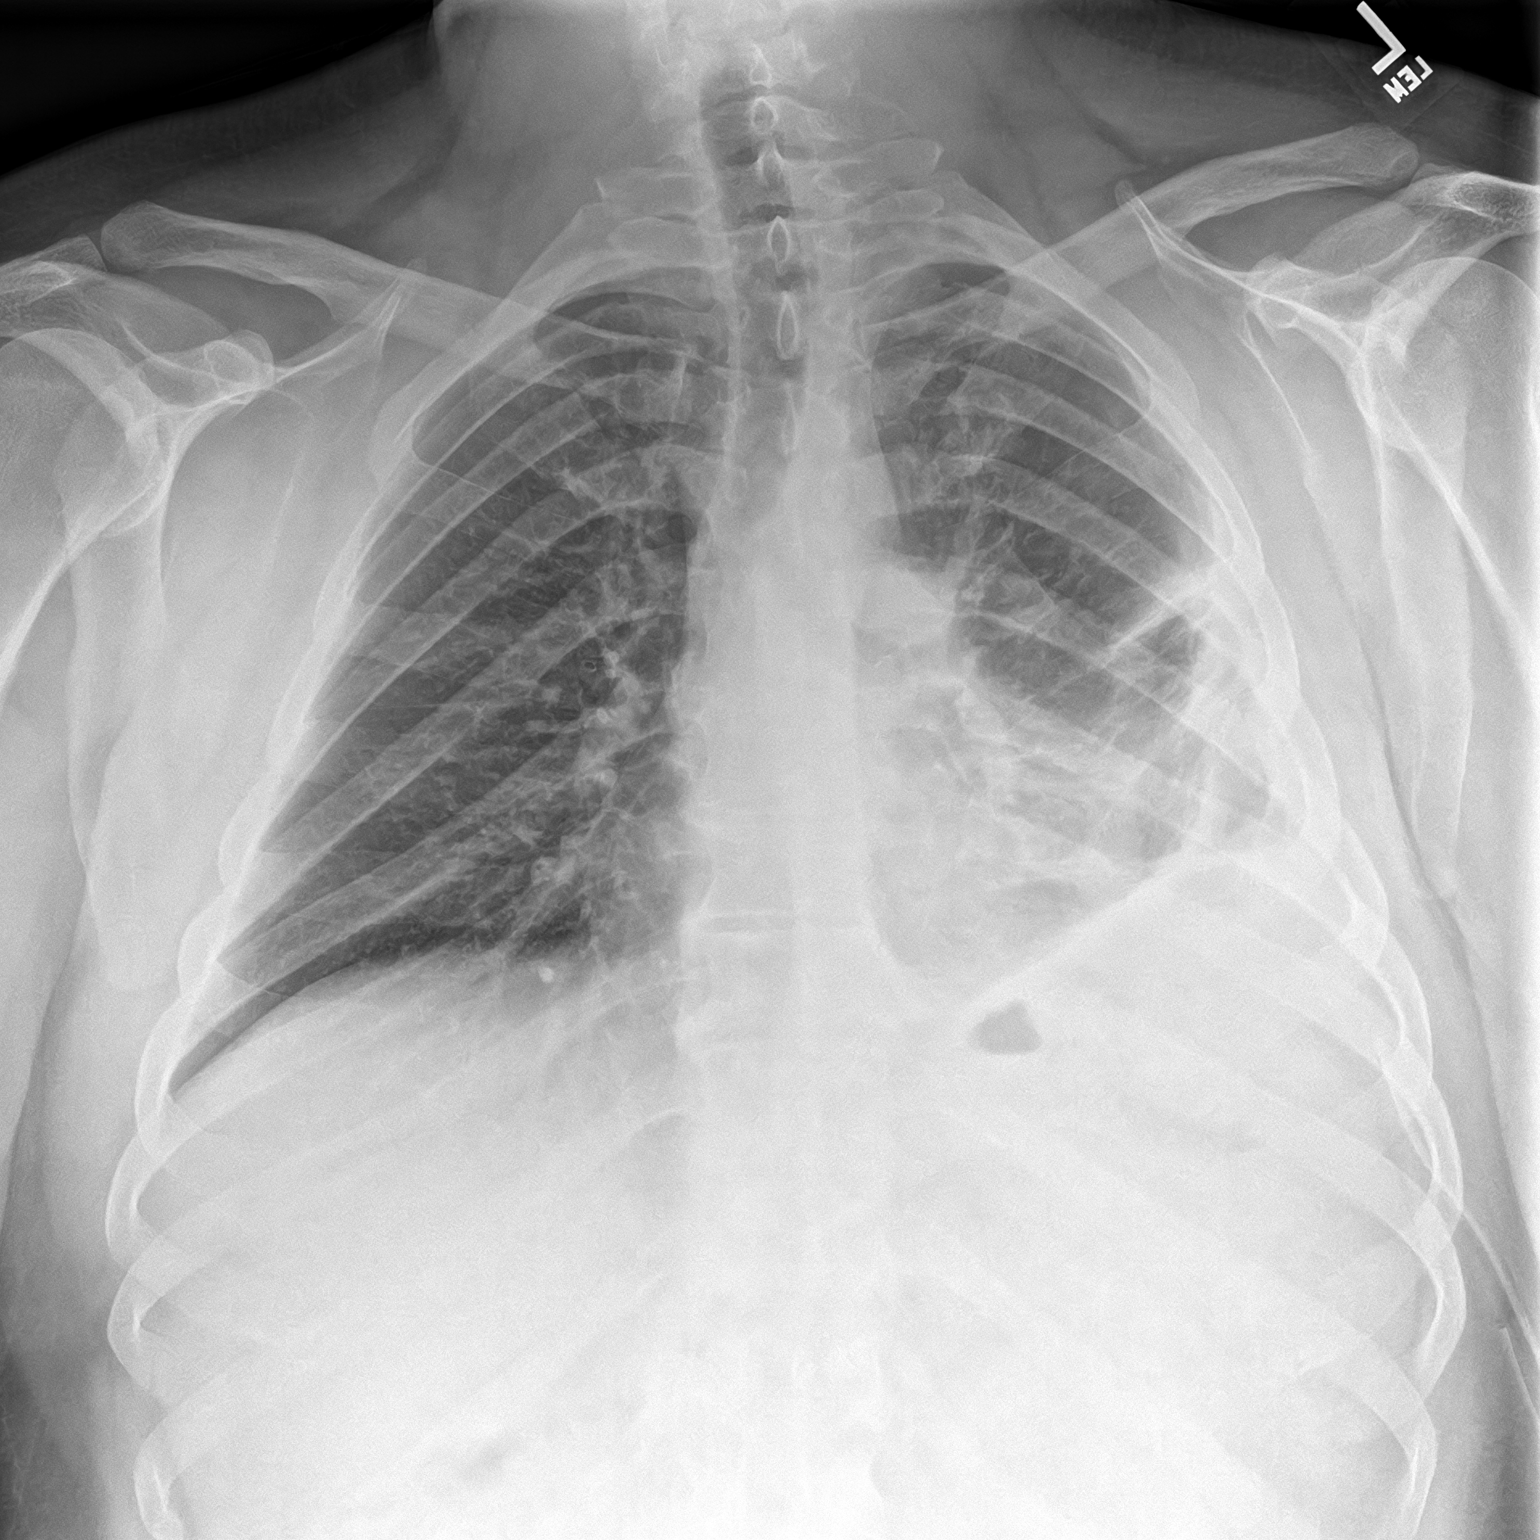
[im 2/2]
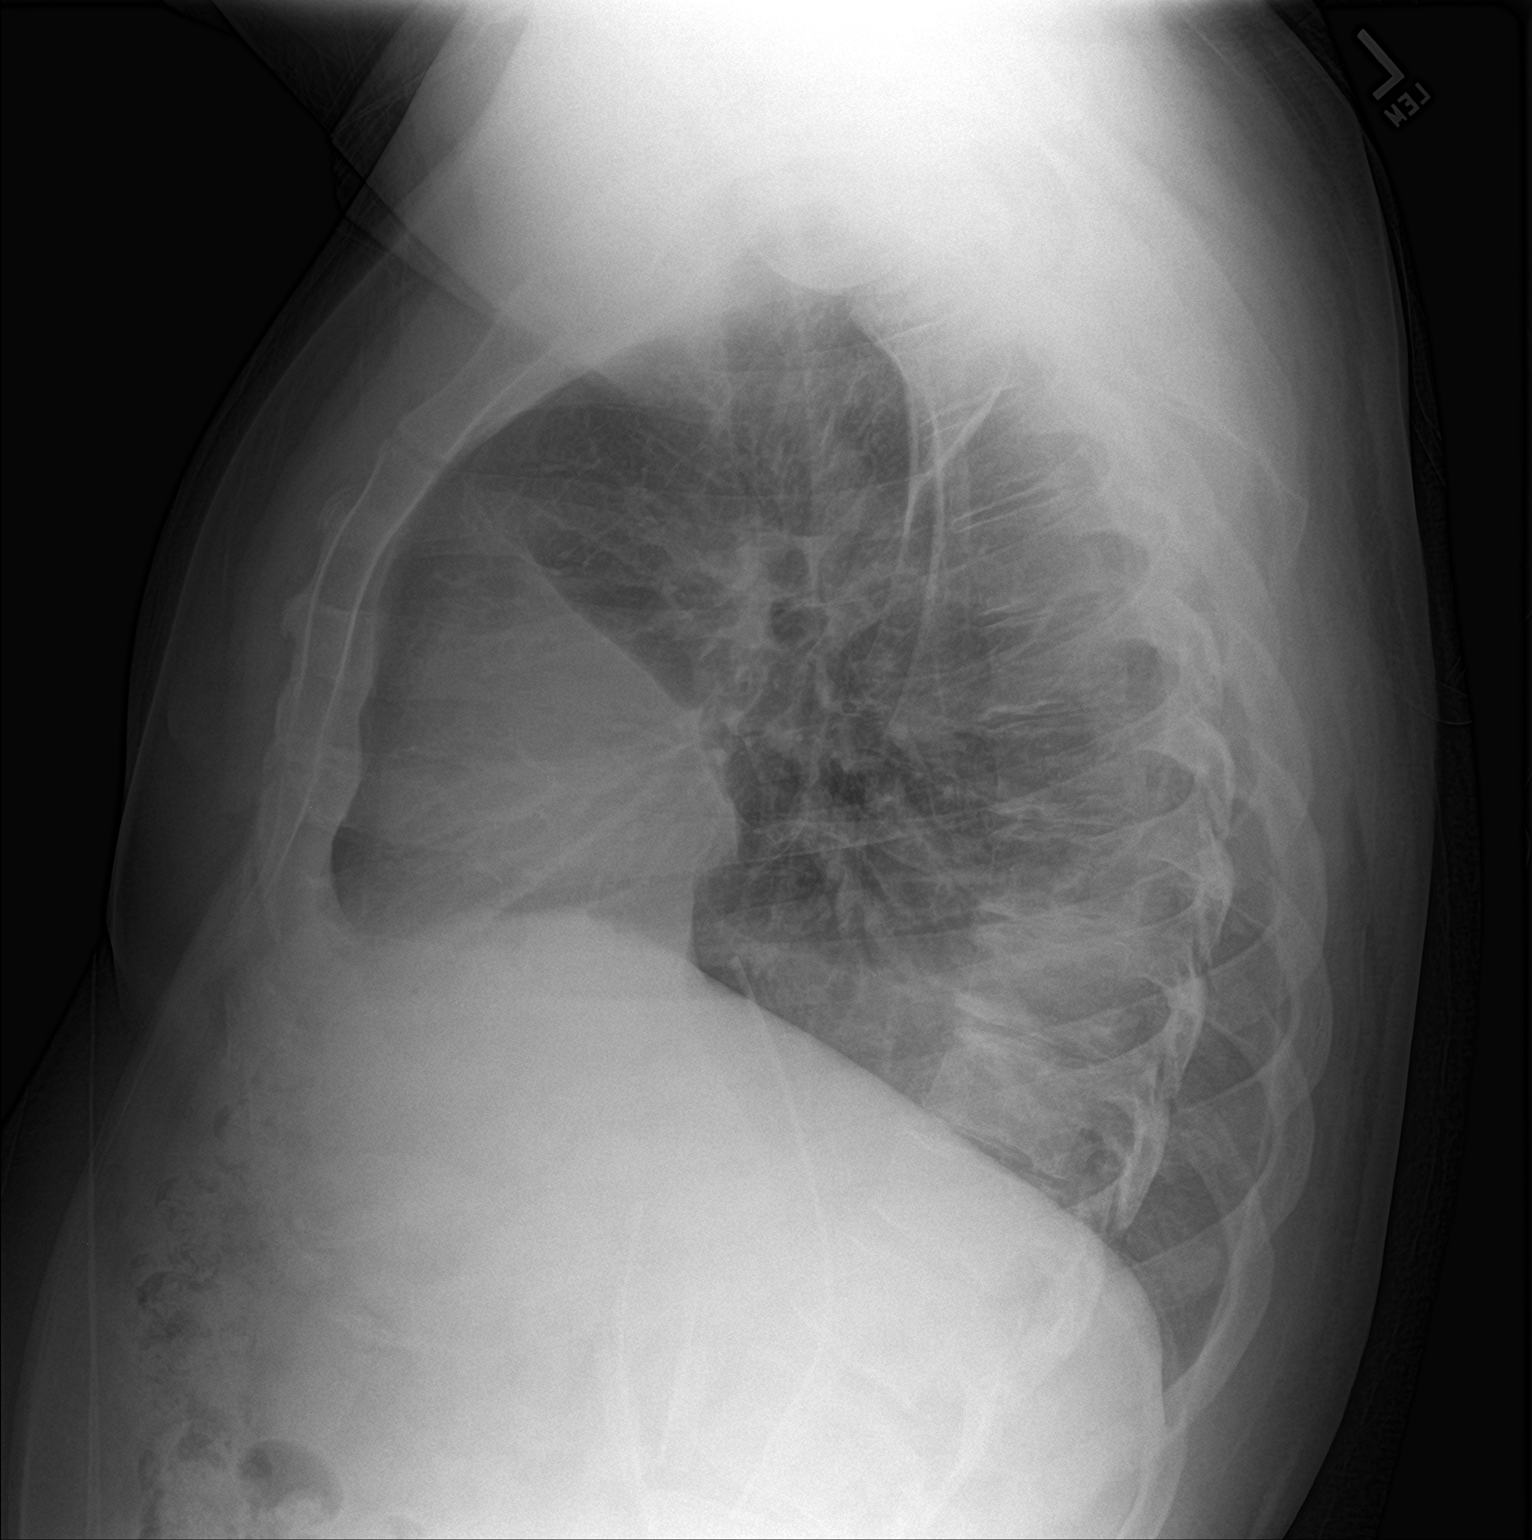

[2 of 2 positions shown; findings below may reference images not displayed]

FINDINGS: Two left-sided chest tubes noted on prior exam have been removed. No
pneumothorax is noted. Right lung is clear. Moderate left lung
opacity is noted consistent with atelectasis or scarring with mild
associated loculated pleural effusion. Bony thorax is unremarkable.
IMPRESSION: No pneumothorax status post removal 2 left-sided chest tubes. Stable
left lung densities are noted concerning for scarring or atelectasis
with minimal associated loculated pleural effusion.

## 2023-02-12 ENCOUNTER — Ambulatory Visit (LOCAL_COMMUNITY_HEALTH_CENTER): Payer: BC Managed Care – PPO

## 2023-02-12 DIAGNOSIS — Z111 Encounter for screening for respiratory tuberculosis: Secondary | ICD-10-CM

## 2023-02-15 ENCOUNTER — Ambulatory Visit: Payer: BC Managed Care – PPO

## 2023-02-15 DIAGNOSIS — Z111 Encounter for screening for respiratory tuberculosis: Secondary | ICD-10-CM

## 2023-02-15 LAB — TB SKIN TEST
Induration: 0 mm
TB Skin Test: NEGATIVE
